# Patient Record
Sex: Female | Born: 1960
Health system: Southern US, Community
[De-identification: ages and names within clinical notes are randomized; demographics above are authoritative.]

## PROBLEM LIST (undated history)

## (undated) DIAGNOSIS — I10 Essential (primary) hypertension: Secondary | ICD-10-CM

## (undated) DIAGNOSIS — E039 Hypothyroidism, unspecified: Secondary | ICD-10-CM

## (undated) DIAGNOSIS — E669 Obesity, unspecified: Secondary | ICD-10-CM

## (undated) DIAGNOSIS — IMO0002 Reserved for concepts with insufficient information to code with codable children: Secondary | ICD-10-CM

## (undated) DIAGNOSIS — C50919 Malignant neoplasm of unspecified site of unspecified female breast: Secondary | ICD-10-CM

## (undated) DIAGNOSIS — C4491 Basal cell carcinoma of skin, unspecified: Secondary | ICD-10-CM

## (undated) DIAGNOSIS — R112 Nausea with vomiting, unspecified: Secondary | ICD-10-CM

## (undated) DIAGNOSIS — N809 Endometriosis, unspecified: Secondary | ICD-10-CM

## (undated) DIAGNOSIS — E1165 Type 2 diabetes mellitus with hyperglycemia: Secondary | ICD-10-CM

## (undated) DIAGNOSIS — T4145XA Adverse effect of unspecified anesthetic, initial encounter: Secondary | ICD-10-CM

## (undated) DIAGNOSIS — T8859XA Other complications of anesthesia, initial encounter: Secondary | ICD-10-CM

## (undated) DIAGNOSIS — I219 Acute myocardial infarction, unspecified: Secondary | ICD-10-CM

## (undated) DIAGNOSIS — E785 Hyperlipidemia, unspecified: Secondary | ICD-10-CM

## (undated) DIAGNOSIS — Z9889 Other specified postprocedural states: Secondary | ICD-10-CM

## (undated) DIAGNOSIS — E118 Type 2 diabetes mellitus with unspecified complications: Secondary | ICD-10-CM

## (undated) DIAGNOSIS — I2510 Atherosclerotic heart disease of native coronary artery without angina pectoris: Secondary | ICD-10-CM

## (undated) HISTORY — PX: CARDIAC CATHETERIZATION: SHX172

## (undated) HISTORY — PX: BREAST SURGERY: SHX581

## (undated) HISTORY — PX: ABDOMINAL HYSTERECTOMY: SHX81

## (undated) HISTORY — PX: EYE SURGERY: SHX253

## (undated) HISTORY — PX: MASTECTOMY: SHX3

---

## 1998-02-11 ENCOUNTER — Other Ambulatory Visit: Admission: RE | Admit: 1998-02-11 | Discharge: 1998-02-11 | Payer: Self-pay | Admitting: Obstetrics & Gynecology

## 1999-03-23 ENCOUNTER — Other Ambulatory Visit: Admission: RE | Admit: 1999-03-23 | Discharge: 1999-03-23 | Payer: Self-pay | Admitting: Obstetrics & Gynecology

## 2000-04-03 ENCOUNTER — Other Ambulatory Visit: Admission: RE | Admit: 2000-04-03 | Discharge: 2000-04-03 | Payer: Self-pay | Admitting: Obstetrics & Gynecology

## 2001-01-22 ENCOUNTER — Ambulatory Visit (HOSPITAL_COMMUNITY): Admission: RE | Admit: 2001-01-22 | Discharge: 2001-01-22 | Payer: Self-pay | Admitting: Oncology

## 2001-01-22 ENCOUNTER — Encounter (HOSPITAL_COMMUNITY): Payer: Self-pay | Admitting: Oncology

## 2001-05-16 HISTORY — PX: TOTAL ABDOMINAL HYSTERECTOMY W/ BILATERAL SALPINGOOPHORECTOMY: SHX83

## 2002-01-21 ENCOUNTER — Encounter (HOSPITAL_COMMUNITY): Payer: Self-pay | Admitting: Oncology

## 2002-01-21 ENCOUNTER — Ambulatory Visit (HOSPITAL_COMMUNITY): Admission: RE | Admit: 2002-01-21 | Discharge: 2002-01-21 | Payer: Self-pay | Admitting: Oncology

## 2002-08-12 ENCOUNTER — Other Ambulatory Visit: Admission: RE | Admit: 2002-08-12 | Discharge: 2002-08-12 | Payer: Self-pay | Admitting: Obstetrics & Gynecology

## 2002-09-17 ENCOUNTER — Ambulatory Visit: Admission: RE | Admit: 2002-09-17 | Discharge: 2002-09-17 | Payer: Self-pay | Admitting: Gynecologic Oncology

## 2002-10-03 ENCOUNTER — Encounter: Payer: Self-pay | Admitting: Gynecology

## 2002-10-08 ENCOUNTER — Inpatient Hospital Stay (HOSPITAL_COMMUNITY): Admission: RE | Admit: 2002-10-08 | Discharge: 2002-10-11 | Payer: Self-pay | Admitting: Obstetrics & Gynecology

## 2002-10-08 ENCOUNTER — Encounter (INDEPENDENT_AMBULATORY_CARE_PROVIDER_SITE_OTHER): Payer: Self-pay

## 2004-04-13 ENCOUNTER — Ambulatory Visit: Payer: Self-pay | Admitting: Oncology

## 2004-04-19 ENCOUNTER — Ambulatory Visit (HOSPITAL_COMMUNITY): Admission: RE | Admit: 2004-04-19 | Discharge: 2004-04-19 | Payer: Self-pay | Admitting: Oncology

## 2005-04-11 ENCOUNTER — Ambulatory Visit: Payer: Self-pay | Admitting: Oncology

## 2009-04-02 ENCOUNTER — Ambulatory Visit (HOSPITAL_COMMUNITY): Admission: RE | Admit: 2009-04-02 | Discharge: 2009-04-02 | Payer: Self-pay | Admitting: Ophthalmology

## 2010-08-18 LAB — BASIC METABOLIC PANEL
BUN: 11 mg/dL (ref 6–23)
CO2: 29 mEq/L (ref 19–32)
Calcium: 9.6 mg/dL (ref 8.4–10.5)
Chloride: 105 mEq/L (ref 96–112)
Creatinine, Ser: 0.6 mg/dL (ref 0.4–1.2)
GFR calc Af Amer: >60 mL/min (ref >60)
GFR calc non Af Amer: >60 mL/min (ref >60)
Glucose, Bld: 139 mg/dL — ABNORMAL HIGH (ref 70–99)
Potassium: 4.7 mEq/L (ref 3.5–5.1)
Sodium: 141 mEq/L (ref 135–145)

## 2010-08-18 LAB — HEMOGLOBIN AND HEMATOCRIT, BLOOD
HCT: 37.5 % (ref 36.0–46.0)
Hemoglobin: 12.8 g/dL (ref 12.0–15.0)

## 2010-08-18 LAB — GLUCOSE, CAPILLARY: Glucose-Capillary: 169 mg/dL — ABNORMAL HIGH (ref 70–99)

## 2010-10-01 NOTE — Discharge Summary (Signed)
[name], [name]                         ACCOUNT NO.:  [id number]   MEDICAL RECORD NO.:  [id number]                   PATIENT TYPE:  INP   LOCATION:  0463                                 FACILITY:  [facility]   PHYSICIAN:  Ilda Mori, M.D.                DATE OF BIRTH:  11-27-1960   DATE OF ADMISSION:  10/08/2002  DATE OF DISCHARGE:  10/11/2002                                 DISCHARGE SUMMARY   FINAL DIAGNOSES:  1. Ovarian endometriosis.  2. Uterine endometriosis.   SECONDARY DIAGNOSES:  1. History of breast malignancy.  2. Chronic hypertension.   PROCEDURES:  Total abdominal hysterectomy and bilateral salpingo-  oophorectomy.   SURGEON:  Dr. Stanford Breed.   ASSISTANT:  Dr. Ilda Mori.   COMPLICATIONS:  None.   CONDITION ON DISCHARGE:  Improved.   HISTORY OF PRESENT ILLNESS:  This is a 50 year old gravida 1, para 1 female  who presents with a persistent right adnexal mass and dysmenorrhea.  The  patient has a history of stage I breast cancer which was diagnosed in  September 1995.  The patient has been disease-free since that time.  She was  noted to have an ovarian cyst in 2001.  At that time her CA125 was 8.  She  was followed, with no significant change in her symptomatology of her  adnexal mass.  A follow-up ultrasound was done in April 2004, which revealed  that the mass had grown slightly, and her CA125 had risen slightly from 8 in  2002 to 28 in April 2004.  Because of the slight increased size of the mass,  the decision was made to proceed with laparotomy.  In addition, the patient  claimed that since her menses have returned following her chemotherapy her  periods have been painful and, for this reason, it was felt that regardless  of whether or not the mass was malignant a hysterectomy would be performed.   HOSPITAL COURSE:  The patient was taken to the operating room on the day of  surgery, where a total abdominal hysterectomy and bilateral  salpingo-  oophorectomy were performed.  Frozen section on the right ovarian mass  revealed endometriosis.  The patient's postoperative course was benign,  without significant fever or anemia.  On the third postoperative day she was  felt to be ready for discharge.  She was discharged on a regular diet, told  to limit her activity.  She was given Tylox 1-2 tablets to take every four  hours for pain, and to return to the office in two weeks for follow-up  evaluation.   LABORATORY DATA:  Her pathology report confirmed endometriosis in the right  ovary.  The uterus contained serosal endometriosis.  The left ovary, as  well, contained endometriosis.  There were no atypical or cancer cells in  the specimen.  Her admission hemoglobin was 12.3 with a white count of 8100.  Postoperatively her  hemoglobin  was 11.1 with a white count of 1400.  Liver function tests were performed  and were within normal limits.  Her creatinine, electrolytes, and glucose  were normal preoperatively.  While she was on IVs her glucose rose to 163.  Her blood type is A positive.                                               Ilda Mori, M.D.    RK/MEDQ  D:  10/30/2002  T:  10/30/2002  Job:  [id number]   cc:   De Blanch, M.D.

## 2010-10-01 NOTE — Consult Note (Signed)
Hansen, [name]                         ACCOUNT NO.:  [id number]   MEDICAL RECORD NO.:  [id number]                   PATIENT TYPE:  OUT   LOCATION:  GYN                                  FACILITY:  [facility]   PHYSICIAN:  [name], M.D.                 DATE OF BIRTH:  May 28, 1960   DATE OF CONSULTATION:  09/17/2002  DATE OF DISCHARGE:                                   CONSULTATION   CHIEF COMPLAINT:  This 50 year old woman is seen at the request of Dr.  Ilda Hansen because of a persistent/enlarging adnexal mass with a prior  history of early-onset breast cancer.   HISTORY OF PRESENT ILLNESS:  The patient was treated for left breast cancer  in 09/95 with, eventually, a left modified radical mastectomy and lymph node  dissection.  She had stage I disease and positive receptors.  She was  treated with six months of adjutant chemotherapy with CMF followed by five  years of tamoxifen through 02/1999.  She was amenorrheic immediate during  and after chemotherapy, but menses resumed, and she is currently having  menses q/ month with flow lasting for approximately seven days.  She was  suspected to have an adnexal mass on examination in 12/01, and ultrasound  revealed a 4.8-cm right ovarian cyst.  Follow-up ultrasound on 05/02  revealed a 5.6 x 4.5 x 3.9 cyst with a thick septation.  Her CA-125 was 8,  and discussion was held with Dr. Loree Hansen.  It was elected to follow  the patient; however, the patient states that, while she had annual  examinations, she had no follow-up ultrasounds until 04/04, when ultrasound  revealed a 6.0 x 5.0 x 4.6-cm complex cyst with thick septum.  There was a  questionable small amount of free fluid in the pelvis, and CA-125 value was  27.9.   The patient notes voluntary weight loss over the past six months and was  asymptomatic until a couple of months ago when she began experiencing a dull  ache in the right lower quadrant.  She denies  dysmenorrhea or dyspareunia.  She does complain of painful epigastric cramps, which arise sporadically  when she is under pressure or anxious, approximately three times per month.  These usually are self-limited and are occasionally associated with  diarrhea.   PAST MEDICAL HISTORY:  1. Psoriasis.  2. Breast cancer, as above.  3. Depression (nonsuicidal).  4. Hypertension.   PAST SURGICAL HISTORY:  1. Caesarean section.  2. Left modified radical mastectomy/nodes, as above.   MEDICATIONS:  1. Lisinopril.  2. Lexapro.   ALLERGIES:  None.   PERSONAL SOCIAL HISTORY:  The patient is married.  Denies tobacco or  ethanol.   FAMILY HISTORY:  Mother with uterine cancer but no breast, ovarian or colon  malignancies.   REVIEW OF SYSTEMS:  Otherwise negative, including no general systemic  complaints, neurologic complaints, symptoms  of asthma, pneumonia,  hemoptysis, angina, congestive heart failure or palpitations. GI:  Symptoms  as noted above with no real change in bowel function.  GU:  No change in  bladder function, hematuria, stones, recurrent UTI.  OB/GYN:  Per HPI.  Heme/Lymph:  No lymphadenopathy or bleeding/bruisability.   PHYSICAL EXAMINATION:  VITAL SIGNS:  210 pounds, blood pressure 132/100,  pulse 100, respirations 24.  GENERAL:  Patient is anxious, alert and oriented x 3, no acute distress.  ENT:  Benign with clear oropharynx.  NECK:  Supple without goiter.  LUNGS:  Fields are clear to percussion and auscultation.  HEART:  Sounds are regular, and there is no JVD.  BREASTS:  Examination is deferred.  ABDOMEN:  The abdomen was soft and benign without ascites, mass, or  organomegaly.  Transverse lower abdominal incision is well-healed.  The  abdomen is nontender, and there is no ascites.  BACK:  There is no back or CVA tenderness.  EXTREMITIES:  Have full strength without edema.  NEUROLOGIC:  Screen is normal.  PELVIC:  External genitalia, BUS are normal to  inspection/palpation.  The  bladder and urethra are normal with good support.  Cervix is mobile without  lesions or tenderness to mobility.  Bimanual and rectovaginal examinations  reveal uterus normal size.  A 5-cm nontender cystic mass in the right adnexa  and normal left ovary.  There is no cul-de-sac nodularity or tenderness on  rectovaginal examination.   ASSESSMENT:  1. Likely benign septated cyst.  2. Prior history of premenopausal breast cancer.   PLAN:  I had a long discussion with the patient regarding her disease  process.  I think that this is likely a benign mass, but she has a likely  benign process now but is at increased risk for ovarian and uterine  malignancy in the future, based on her family history, personal history of  early-onset breast cancer and tamoxifen use.  Patient is considering  definitive surgery with TAH/BSO, and I believe that this would be  reasonable.  I would choose to explore this patient through a Pfannenstiel  incision and would obtain a frozen section after removing the mass.  The  surgery will be coordinated between Dr. Arlyce Hansen and our office for some time  in the near future.  I answered multiple questions posed by the patient, and  she is in agreement with this plan.  I, likewise, contacted Dr. Arlyce Hansen by  telephone, and he is in agreement with this assessment.                                               [name], M.D.    JTS/MEDQ  D:  09/17/2002  T:  09/18/2002  Job:  [id number]

## 2010-10-01 NOTE — Op Note (Signed)
[name], [name]                         ACCOUNT NO.:  [id number]   MEDICAL RECORD NO.:  [id number]                   PATIENT TYPE:  INP   LOCATION:  0004                                 [facility]ACILITY:  [facility]   PHYSICIAN:  De Blanch, M.D.         DATE OF BIRTH:  11-20-1960   DATE OF PROCEDURE:  DATE OF DISCHARGE:                                 OPERATIVE REPORT   PREOPERATIVE DIAGNOSIS:  Complex pelvis mass with past history of breast  cancer.   POSTOPERATIVE DIAGNOSIS:  Advanced endometriosis.   OPERATION/PROCEDURE:  1. Total abdominal hysterectomy.  2. Bilateral salpingo-oophorectomy.  3. Lysis of adhesions.   SURGEON:  De Blanch, M.D.   ASSISTANT:  Ilda Mori, M.D.  Telford Nab, R.N.   ANESTHESIA:  General orotracheal tube.   ESTIMATED BLOOD LOSS:  250 mL.   SURGICAL FINDINGS:  At time of exploratory laparotomy, the patient had a 7  cm right ovarian mass which was densely adherent to the lateral pelvic  sidewall, the sigmoid mesentery, posterior cul-de-sac and the posterior  aspect of the broad ligament and uterus.  There were endometriosis implants  on the left tube and ovary and anterior cul-de-sac and the posterior cul-de-  sac was densely involved with deep endometriosis.  The uterus was slightly  enlarged.  On frozen section, a mesocyst adenoma was found within the  endometrioma on the right side.  No evidence of malignancy was found.   DESCRIPTION OF PROCEDURE:  The patient was brought to the operating room and  after satisfactory attainment of general anesthesia, was placed in the  modified lithotomy position in Jackpot stirrups.  The anterior abdominal wall,  perineum and vagina were prepped with Betadine.  A Foley catheter was placed  and the patient was draped.  The abdomen was entered through a low midline  incision.  The upper abdomen was explored and all organs and diaphragm were  found to be normal.  Buckwalter retractor  was positioned.  The bowel was  packed out of the pelvis.  The right retroperitoneum was incised.  The round  ligament was divided and the proximal portion of the round ligament grasped  with retraction.  The retroperitoneal space was opened, identified the  vessels and ureter.  The ovarian vessels were skeletonized, clamped, cut,  free tied and suture ligated.  The bladder flap was advanced.  Using sharp  and blunt dissection, the ureter was identified and followed.  As we  mobilized the cystic mass that was adherent to the sigmoid colon, it  ruptured and old blood extruded from the cyst consistent with a chocolate  cyst of the endometriosis.  With care taken to avoid injury to the ureter,  the peritoneum of the broad ligament and lateral pelvic sidewall was  incised, thus mobilizing the adherent ovary and fallopian tube until they  were fully mobilized.  These were then transected from the ovarian ligament  and fallopian tube and  submitted to frozen section with the above-noted  findings.  The left pelvic sidewall was opened, identify the left ureter.  The ovarian vessels were skeletonized, clamped, cut, free tied and suture  ligated.  The peritoneum was likewise incised just above the ureter with the  ureter under direct visualization.  The bladder flap was advanced.  The  uterine vessels were skeletonized, clamped, cut and suture ligated.  The  rectovaginal septum was then developed with sharp dissection.  Hemostasis  was then achieved with electrocautery.  Once the rectum was mobilized from  its dense adherence to the posterior aspect of the uterus, we proceeded with  clamping the cardinal ligaments, uterosacral ligaments and vaginal angles.  The vagina was transected at its juncture with the cervix.  The uterus,  cervix, left tube and ovary were handed off the operative field.  Vaginal  angles were transfixed and the central portion of the vagina closed with  interrupted  figure-of-eight sutures of 0 Vicryl.  There was oozing in the  posterior cul-de-sac where the endometriosis was quite extensive and where  we did extensive dissection.  This was controlled with a series of Halban-  type sutures using 0 Vicryl.  Initial hemostasis was achieved with  electrocautery.  The pelvis was irrigated.  Hemostasis was noted.  The packs  and retractors were removed.  The anterior abdominal wall was then closed in  layers, first in a running mass closure using #1 PDS.  It was noted the  patient had a small umbilical hernia.  The fascial defect was closed with an  interrupted figure-of-eight suture of 0 Prolene.  The subcutaneous tissue  was irrigated.  Hemostasis achieved with cautery and reapproximated with  interrupted 3-0 Vicryl sutures.  Skin was closed with skin staples.  Dressing was applied.  The patient was awakened from anesthesia and taken to  the recovery room in satisfactory condition.  Sponge, needle and instrument  counts were correct x2.                                               De Blanch, M.D.    DC/MEDQ  D:  10/08/2002  T:  10/08/2002  Job:  [id number]   cc:   Ilda Mori, M.D.  [address]  [place], [place] [address]  Fax: [phone]   Telford Nab, R.N.

## 2011-05-17 DIAGNOSIS — C4491 Basal cell carcinoma of skin, unspecified: Secondary | ICD-10-CM

## 2011-05-17 HISTORY — DX: Basal cell carcinoma of skin, unspecified: C44.91

## 2011-05-17 HISTORY — PX: SKIN CANCER EXCISION: SHX779

## 2012-10-03 ENCOUNTER — Encounter (HOSPITAL_COMMUNITY): Payer: Self-pay

## 2012-10-03 ENCOUNTER — Inpatient Hospital Stay (HOSPITAL_COMMUNITY)
Admission: EM | Admit: 2012-10-03 | Discharge: 2012-10-06 | DRG: 247 | Disposition: A | Discharge status: Home / Self Care | Payer: 59 | Attending: Cardiology | Admitting: Cardiology

## 2012-10-03 ENCOUNTER — Emergency Department (HOSPITAL_COMMUNITY): Payer: 59

## 2012-10-03 DIAGNOSIS — K838 Other specified diseases of biliary tract: Secondary | ICD-10-CM | POA: Diagnosis present

## 2012-10-03 DIAGNOSIS — R079 Chest pain, unspecified: Secondary | ICD-10-CM

## 2012-10-03 DIAGNOSIS — E1165 Type 2 diabetes mellitus with hyperglycemia: Secondary | ICD-10-CM

## 2012-10-03 DIAGNOSIS — Z853 Personal history of malignant neoplasm of breast: Secondary | ICD-10-CM

## 2012-10-03 DIAGNOSIS — I214 Non-ST elevation (NSTEMI) myocardial infarction: Secondary | ICD-10-CM | POA: Diagnosis present

## 2012-10-03 DIAGNOSIS — E119 Type 2 diabetes mellitus without complications: Secondary | ICD-10-CM

## 2012-10-03 DIAGNOSIS — E039 Hypothyroidism, unspecified: Secondary | ICD-10-CM

## 2012-10-03 DIAGNOSIS — E118 Type 2 diabetes mellitus with unspecified complications: Secondary | ICD-10-CM | POA: Diagnosis present

## 2012-10-03 DIAGNOSIS — Z780 Asymptomatic menopausal state: Secondary | ICD-10-CM

## 2012-10-03 DIAGNOSIS — C50919 Malignant neoplasm of unspecified site of unspecified female breast: Secondary | ICD-10-CM

## 2012-10-03 DIAGNOSIS — Z85828 Personal history of other malignant neoplasm of skin: Secondary | ICD-10-CM

## 2012-10-03 DIAGNOSIS — R0989 Other specified symptoms and signs involving the circulatory and respiratory systems: Secondary | ICD-10-CM | POA: Diagnosis present

## 2012-10-03 DIAGNOSIS — E785 Hyperlipidemia, unspecified: Secondary | ICD-10-CM

## 2012-10-03 DIAGNOSIS — R748 Abnormal levels of other serum enzymes: Secondary | ICD-10-CM | POA: Diagnosis present

## 2012-10-03 DIAGNOSIS — Z90710 Acquired absence of both cervix and uterus: Secondary | ICD-10-CM

## 2012-10-03 DIAGNOSIS — I2510 Atherosclerotic heart disease of native coronary artery without angina pectoris: Principal | ICD-10-CM | POA: Diagnosis present

## 2012-10-03 DIAGNOSIS — I2584 Coronary atherosclerosis due to calcified coronary lesion: Secondary | ICD-10-CM | POA: Diagnosis present

## 2012-10-03 DIAGNOSIS — R161 Splenomegaly, not elsewhere classified: Secondary | ICD-10-CM | POA: Diagnosis present

## 2012-10-03 DIAGNOSIS — Z6837 Body mass index (BMI) 37.0-37.9, adult: Secondary | ICD-10-CM

## 2012-10-03 DIAGNOSIS — IMO0001 Reserved for inherently not codable concepts without codable children: Secondary | ICD-10-CM | POA: Diagnosis present

## 2012-10-03 DIAGNOSIS — E8940 Asymptomatic postprocedural ovarian failure: Secondary | ICD-10-CM

## 2012-10-03 DIAGNOSIS — IMO0002 Reserved for concepts with insufficient information to code with codable children: Secondary | ICD-10-CM

## 2012-10-03 DIAGNOSIS — Z87891 Personal history of nicotine dependence: Secondary | ICD-10-CM

## 2012-10-03 DIAGNOSIS — Z955 Presence of coronary angioplasty implant and graft: Secondary | ICD-10-CM

## 2012-10-03 DIAGNOSIS — R739 Hyperglycemia, unspecified: Secondary | ICD-10-CM

## 2012-10-03 DIAGNOSIS — I10 Essential (primary) hypertension: Secondary | ICD-10-CM

## 2012-10-03 DIAGNOSIS — R0609 Other forms of dyspnea: Secondary | ICD-10-CM | POA: Diagnosis present

## 2012-10-03 HISTORY — DX: Type 2 diabetes mellitus with hyperglycemia: E11.65

## 2012-10-03 HISTORY — DX: Essential (primary) hypertension: I10

## 2012-10-03 HISTORY — DX: Endometriosis, unspecified: N80.9

## 2012-10-03 HISTORY — DX: Reserved for concepts with insufficient information to code with codable children: IMO0002

## 2012-10-03 HISTORY — DX: Hyperlipidemia, unspecified: E78.5

## 2012-10-03 HISTORY — DX: Obesity, unspecified: E66.9

## 2012-10-03 HISTORY — DX: Malignant neoplasm of unspecified site of unspecified female breast: C50.919

## 2012-10-03 HISTORY — DX: Basal cell carcinoma of skin, unspecified: C44.91

## 2012-10-03 HISTORY — DX: Type 2 diabetes mellitus with unspecified complications: E11.8

## 2012-10-03 LAB — CBC WITH DIFFERENTIAL/PLATELET
Basophils Absolute: 0 10*3/uL (ref 0.0–0.1)
Basophils Relative: 0 % (ref 0–1)
Eosinophils Absolute: 0.2 10*3/uL (ref 0.0–0.7)
Eosinophils Relative: 2 % (ref 0–5)
Lymphs Abs: 2.4 10*3/uL (ref 0.7–4.0)
MCH: 30.1 pg (ref 26.0–34.0)
MCV: 88.8 fL (ref 78.0–100.0)
Monocytes Absolute: 0.4 10*3/uL (ref 0.1–1.0)
Platelets: 217 10*3/uL (ref 150–400)
RDW: 12.5 % (ref 11.5–15.5)

## 2012-10-03 LAB — TROPONIN I
Troponin I: 0.4 ng/mL (ref <0.30)
Troponin I: <0.3 ng/mL (ref <0.30)

## 2012-10-03 LAB — URINE MICROSCOPIC-ADD ON

## 2012-10-03 LAB — URINALYSIS, ROUTINE W REFLEX MICROSCOPIC
Hgb urine dipstick: NEGATIVE
Leukocytes, UA: NEGATIVE
Nitrite: NEGATIVE
Protein, ur: NEGATIVE mg/dL
Specific Gravity, Urine: >1.03 — ABNORMAL HIGH (ref 1.005–1.030)
Urobilinogen, UA: 0.2 mg/dL (ref 0.0–1.0)

## 2012-10-03 LAB — COMPREHENSIVE METABOLIC PANEL
ALT: 31 U/L (ref 0–35)
AST: 32 U/L (ref 0–37)
Alkaline Phosphatase: 119 U/L — ABNORMAL HIGH (ref 39–117)
Calcium: 9.3 mg/dL (ref 8.4–10.5)
Creatinine, Ser: 0.4 mg/dL — ABNORMAL LOW (ref 0.50–1.10)
GFR calc Af Amer: >90 mL/min (ref >90)
Glucose, Bld: 331 mg/dL — ABNORMAL HIGH (ref 70–99)
Potassium: 3.9 mEq/L (ref 3.5–5.1)
Sodium: 133 mEq/L — ABNORMAL LOW (ref 135–145)
Total Protein: 7.1 g/dL (ref 6.0–8.3)

## 2012-10-03 LAB — LIPID PANEL
LDL Cholesterol: 186 mg/dL — ABNORMAL HIGH (ref 0–99)
Total CHOL/HDL Ratio: 5.4 RATIO
VLDL: 36 mg/dL (ref 0–40)

## 2012-10-03 LAB — LIPASE, BLOOD: Lipase: 168 U/L — ABNORMAL HIGH (ref 11–59)

## 2012-10-03 LAB — GLUCOSE, CAPILLARY

## 2012-10-03 LAB — D-DIMER, QUANTITATIVE: D-Dimer, Quant: <0.27 ug/mL-FEU (ref 0.00–0.48)

## 2012-10-03 MED ORDER — SODIUM CHLORIDE 0.9 % IJ SOLN
3.0000 mL | Freq: Two times a day (BID) | INTRAMUSCULAR | Status: DC
  Administered 2012-10-04 (×2): 3 mL via INTRAVENOUS

## 2012-10-03 MED ORDER — CITALOPRAM HYDROBROMIDE 40 MG PO TABS
40.0000 mg | ORAL_TABLET | Freq: Every day | ORAL | Status: DC
  Administered 2012-10-03 – 2012-10-06 (×4): 40 mg via ORAL
  Filled 2012-10-03: qty 2
  Filled 2012-10-03: qty 1
  Filled 2012-10-03 (×2): qty 2

## 2012-10-03 MED ORDER — HEPARIN BOLUS VIA INFUSION
4000.0000 [IU] | Freq: Once | INTRAVENOUS | Status: AC
  Administered 2012-10-03: 4000 [IU] via INTRAVENOUS
  Filled 2012-10-03: qty 4000

## 2012-10-03 MED ORDER — ONDANSETRON HCL 4 MG PO TABS
4.0000 mg | ORAL_TABLET | Freq: Four times a day (QID) | ORAL | Status: DC | PRN
  Administered 2012-10-05: 4 mg via ORAL
  Filled 2012-10-03: qty 1

## 2012-10-03 MED ORDER — SODIUM CHLORIDE 0.9 % IV SOLN
INTRAVENOUS | Status: AC
  Administered 2012-10-03: 13:00:00 via INTRAVENOUS

## 2012-10-03 MED ORDER — ENOXAPARIN SODIUM 40 MG/0.4ML ~~LOC~~ SOLN: 40.0000 mg | SUBCUTANEOUS | Status: DC

## 2012-10-03 MED ORDER — LISINOPRIL 10 MG PO TABS
10.0000 mg | ORAL_TABLET | Freq: Every day | ORAL | Status: DC
  Administered 2012-10-03 – 2012-10-06 (×4): 10 mg via ORAL
  Filled 2012-10-03 (×4): qty 1

## 2012-10-03 MED ORDER — INSULIN ASPART 100 UNIT/ML ~~LOC~~ SOLN
0.0000 [IU] | Freq: Three times a day (TID) | SUBCUTANEOUS | Status: DC
  Administered 2012-10-04: 11 [IU] via SUBCUTANEOUS
  Administered 2012-10-04: 5 [IU] via SUBCUTANEOUS
  Administered 2012-10-04 – 2012-10-05 (×3): 3 [IU] via SUBCUTANEOUS
  Administered 2012-10-05: 8 [IU] via SUBCUTANEOUS

## 2012-10-03 MED ORDER — NITROGLYCERIN 0.4 MG SL SUBL: 0.4000 mg | SUBLINGUAL_TABLET | SUBLINGUAL | Status: DC | PRN

## 2012-10-03 MED ORDER — ASPIRIN 81 MG PO CHEW
324.0000 mg | CHEWABLE_TABLET | Freq: Once | ORAL | Status: AC
  Administered 2012-10-03: 324 mg via ORAL
  Filled 2012-10-03: qty 4

## 2012-10-03 MED ORDER — METOPROLOL TARTRATE 25 MG PO TABS
25.0000 mg | ORAL_TABLET | Freq: Two times a day (BID) | ORAL | Status: DC
  Administered 2012-10-03 – 2012-10-06 (×6): 25 mg via ORAL
  Filled 2012-10-03 (×7): qty 1

## 2012-10-03 MED ORDER — ONDANSETRON HCL 4 MG/2ML IJ SOLN
4.0000 mg | Freq: Four times a day (QID) | INTRAMUSCULAR | Status: DC | PRN
  Administered 2012-10-06: 09:00:00 4 mg via INTRAVENOUS

## 2012-10-03 MED ORDER — ONDANSETRON HCL 4 MG/2ML IJ SOLN: 4.0000 mg | Freq: Three times a day (TID) | INTRAMUSCULAR | Status: DC | PRN

## 2012-10-03 MED ORDER — INSULIN ASPART 100 UNIT/ML ~~LOC~~ SOLN
0.0000 [IU] | Freq: Every day | SUBCUTANEOUS | Status: DC
  Administered 2012-10-03: 3 [IU] via SUBCUTANEOUS

## 2012-10-03 MED ORDER — IBUPROFEN 400 MG PO TABS
400.0000 mg | ORAL_TABLET | Freq: Once | ORAL | Status: AC
  Administered 2012-10-03: 400 mg via ORAL
  Filled 2012-10-03: qty 1

## 2012-10-03 MED ORDER — ACETAMINOPHEN 650 MG RE SUPP: 650.0000 mg | Freq: Four times a day (QID) | RECTAL | Status: DC | PRN

## 2012-10-03 MED ORDER — SODIUM CHLORIDE 0.9 % IV SOLN
INTRAVENOUS | Status: DC
  Administered 2012-10-03: 1000 mL via INTRAVENOUS
  Administered 2012-10-04 (×2): via INTRAVENOUS

## 2012-10-03 MED ORDER — LEVOTHYROXINE SODIUM 125 MCG PO TABS
125.0000 ug | ORAL_TABLET | Freq: Every day | ORAL | Status: DC
  Administered 2012-10-04 – 2012-10-06 (×3): 125 ug via ORAL
  Filled 2012-10-03 (×4): qty 1

## 2012-10-03 MED ORDER — ACETAMINOPHEN 325 MG PO TABS
650.0000 mg | ORAL_TABLET | Freq: Four times a day (QID) | ORAL | Status: DC | PRN
  Administered 2012-10-04 – 2012-10-06 (×6): 650 mg via ORAL
  Filled 2012-10-03 (×3): qty 2

## 2012-10-03 MED ORDER — HEPARIN (PORCINE) IN NACL 100-0.45 UNIT/ML-% IJ SOLN
2000.0000 [IU]/h | INTRAMUSCULAR | Status: DC
  Administered 2012-10-03: 950 [IU]/h via INTRAVENOUS
  Administered 2012-10-04 (×3): 1300 [IU]/h via INTRAVENOUS
  Administered 2012-10-05: 2000 [IU]/h via INTRAVENOUS
  Filled 2012-10-03 (×5): qty 250

## 2012-10-03 MED ORDER — ASPIRIN EC 325 MG PO TBEC
325.0000 mg | DELAYED_RELEASE_TABLET | Freq: Every day | ORAL | Status: DC
Start: 2012-10-03 — End: 2012-10-05
  Administered 2012-10-04 – 2012-10-05 (×2): 325 mg via ORAL
  Filled 2012-10-03 (×2): qty 1

## 2012-10-03 NOTE — Progress Notes (Signed)
CRITICAL VALUE ALERT  Critical value received:  Troponin 0.4  Date of notification:  10/03/2012  Time of notification:  1740  Critical value read back:yes  Nurse who received alert:  Schonewitz, Candelaria Stagers  MD notified (1st page):  Memon  Time of first page:  1742  MD notified (2nd page):  Time of second page:  Responding MD:  Kerry Hough  Time MD responded:  1755

## 2012-10-03 NOTE — ED Notes (Signed)
Pt reports chest pain after eating last night.  Describes as pressure.  Says pain comes and goes and radiates around to back of neck at times.    Reports didn't feel well all day yesterday but the chest pain didn't start until last night.  Also reports nausea and SOB.  PT says feels like needs to belch.  Reports drank some coke this am but said it hurt her chest when she swallowed.  Pt alert and oriented.

## 2012-10-03 NOTE — Progress Notes (Signed)
ANTICOAGULATION CONSULT NOTE - Initial Consult  Pharmacy Consult for Heparin Indication: chest pain/ACS  Allergies  Allergen Reactions  . Codeine Nausea And Vomiting    "deathly sick"  . Demerol (Meperidine)     Pt states all pain meds make her sick on her stomach  . Dilaudid (Hydromorphone Hcl) Nausea And Vomiting  . Morphine And Related Nausea And Vomiting    Patient Measurements: Height: 5\' 4"  (162.6 cm) Weight: 220 lb (99.791 kg) IBW/kg (Calculated) : 54.7 Heparin Dosing Weight: 79 kg  Vital Signs: Temp: 97.8 F (36.6 C) (05/21 1448) Temp src: Oral (05/21 1448) BP: 136/78 mmHg (05/21 1448) Pulse Rate: 82 (05/21 1448)  Labs:  Recent Labs  10/03/12 1018 10/03/12 1712  HGB 13.2  --   HCT 38.9  --   PLT 217  --   CREATININE 0.40*  --   TROPONINI <0.30 0.40*    Estimated Creatinine Clearance: 93.3 ml/min (by C-G formula based on Cr of 0.4).   Medical History: Past Medical History  Diagnosis Date  . Hypertension   . Diabetes mellitus without complication     Medications:  Scheduled:  . sodium chloride   Intravenous STAT  . aspirin EC  325 mg Oral Daily  . citalopram  40 mg Oral Daily  . enoxaparin (LOVENOX) injection  40 mg Subcutaneous Q24H  . [START ON 10/04/2012] insulin aspart  0-15 Units Subcutaneous TID WC  . insulin aspart  0-5 Units Subcutaneous QHS  . [START ON 10/04/2012] levothyroxine  125 mcg Oral QAC breakfast  . lisinopril  10 mg Oral Daily  . metoprolol tartrate  25 mg Oral BID  . sodium chloride  3 mL Intravenous Q12H    Assessment: 52 yo F admitted with chest pain.  Initial troponin & EKG were normal, however 2nd set of cardiac enzymes are now positive.   CBC reviewed.  No bleeding noted.   Goal of Therapy:  Heparin level 0.3-0.7 units/ml Monitor platelets by anticoagulation protocol: Yes   Plan:  Give 4000 units bolus x 1 Start heparin infusion at 950 units/hr Check anti-Xa level in 6 hours and daily while on heparin Continue  to monitor H&H and platelets  Denisha Hoel, [facility] 10/03/2012,6:19 PM

## 2012-10-03 NOTE — H&P (Addendum)
[facility] History and Physical  [name] ZOX:[id number] DOB: 1961/04/24 DOA: 10/03/2012  Referring physician: Dr. Manus Gunning PCP: Estanislado Pandy, MD  Specialists:  Chief Complaint: Chest pain  HPI: [name] is a 52 y.o. female past medical history of hypertension, diabetes, obesity. Patient was in usual state of health when yesterday after having dinner, she developed onset of substernal chest pressure/pain. Patient was watching TV when her symptoms began. She reports this discomfort as starting in her chest radiating up into her neck. It was episodes were was stabbing normocephalic pressure. This was exacerbated by physical exertion and was relieved by rest. Pain has been waxing and waning but has never really resolved since its onset. She has associated shortness of breath, nausea, denies vomiting. She reports diaphoresis. She has never had any similar discomfort in the past. She has had antireflux and reports that this feels significantly different. Denies any fever or cough, no diarrhea or dysuria. She's never had any prior cardiac testing. She was evaluated in the emergency room and had a nonacute EKG and negative troponin. She was referred for observation.  Review of Systems: Pertinent positives as per history of present illness, otherwise negative  Past Medical History  Diagnosis Date  . Hypertension   . Diabetes mellitus without complication    Past Surgical History  Procedure Laterality Date  . Mastectomy     Social History:  reports that she quit smoking about 23 years ago. Her smoking use included Cigarettes. She has a 15 pack-year smoking history. She does not have any smokeless tobacco history on file. She reports that  drinks alcohol. She reports that she does not use illicit drugs.   Allergies  Allergen Reactions  . Codeine Nausea And Vomiting    "deathly sick"  . Demerol (Meperidine)     Pt states all pain meds make her sick on her stomach  . Dilaudid  (Hydromorphone Hcl) Nausea And Vomiting  . Morphine And Related Nausea And Vomiting    Family history: Mother has been diagnosed with Alzheimer's disease, father had a history of heart disease and cardiac bypass, no history of premature coronary disease in the family.  Prior to Admission medications   Medication Sig Start Date End Date Taking? Authorizing Provider  citalopram (CELEXA) 40 MG tablet Take 40 mg by mouth daily.   Yes Historical Provider, MD  levothyroxine (SYNTHROID, LEVOTHROID) 125 MCG tablet Take 125 mcg by mouth daily before breakfast.   Yes Historical Provider, MD  lisinopril (PRINIVIL,ZESTRIL) 10 MG tablet Take 10 mg by mouth daily.   Yes Historical Provider, MD  metFORMIN (GLUCOPHAGE-XR) 500 MG 24 hr tablet Take 1,500 mg by mouth daily with breakfast.   Yes Historical Provider, MD   Physical Exam: Filed Vitals:   10/03/12 1250 10/03/12 1300 10/03/12 1310 10/03/12 1448  BP:    136/78  Pulse: 78 83 79 82  Temp:    97.8 F (36.6 C)  TempSrc:    Oral  Resp: 19 22 14 16   Height:   5\' 4"  (1.626 m)   Weight:   99.791 kg (220 lb)   SpO2: 95% 92% 95% 98%     General:  No acute distress  Eyes: Pupils are equal round react to light  ENT: Mucous membranes are moist  Neck: Supple  Cardiovascular: S1, S2, regular rate and rhythm, chest pain is not reproducible on palpation  Respiratory: Clear to auscultation bilaterally  Abdomen: Soft, nontender, nondistended, bowel sounds are active  Skin: No rashes  Musculoskeletal: No pedal edema bilaterally  Psychiatric: Normal affect, cooperative with exam   Neurologic: Grossly intact, nonfocal  Labs on Admission:  Basic Metabolic Panel:  Recent Labs Lab 10/03/12 1018  NA 133*  K 3.9  CL 95*  CO2 25  GLUCOSE 331*  BUN 10  CREATININE 0.40*  CALCIUM 9.3   Liver Function Tests:  Recent Labs Lab 10/03/12 1018  AST 32  ALT 31  ALKPHOS 119*  BILITOT 0.5  PROT 7.1  ALBUMIN 3.6   No results found for this  basename: LIPASE, AMYLASE,  in the last 168 hours No results found for this basename: AMMONIA,  in the last 168 hours CBC:  Recent Labs Lab 10/03/12 1018  WBC 9.7  NEUTROABS 6.7  HGB 13.2  HCT 38.9  MCV 88.8  PLT 217   Cardiac Enzymes:  Recent Labs Lab 10/03/12 1018  TROPONINI <0.30    BNP (last 3 results) No results found for this basename: PROBNP,  in the last 8760 hours CBG: No results found for this basename: GLUCAP,  in the last 168 hours  Radiological Exams on Admission: Dg Chest 2 View  10/03/2012   *RADIOLOGY REPORT*  Clinical Data: Chest pain, shortness of breath, cough and chest congestion.  CHEST - 2 VIEW  Comparison: 04/19/2004  Findings: There is slight peribronchial thickening consistent with bronchitis.  Lungs are otherwise clear.  Heart size and vascularity are normal.  No osseous abnormality.  IMPRESSION: Slight bronchitic changes.   Original Report Authenticated By: Francene Boyers, M.D.    EKG: Independently reviewed. Sinus rhythm without any acute changes  Assessment/Plan Principal Problem:   Chest pain Active Problems:   Essential hypertension, benign   Type II or unspecified type diabetes mellitus without mention of complication, not stated as uncontrolled   Hypothyroidism   Morbid obesity   1. Chest pain. Patient has significant risk factors including morbid obesity, hypertension, diabetes. It is possible that her discomfort could be related to GERD. But due to multiple risk factors, she'll be admitted to the hospital overnight for serial cardiac enzymes. We will repeat EKG in the morning. Check 2-D echocardiogram. Check lipid panel as well as hemoglobin A1c. We will keep the patient n.p.o. after midnight. We will request a cardiology consultation to see if she needs a stress test while here in the hospital. 2. Diabetes. Hold metformin start sliding scale insulin. 3. Hypertension. Continue lisinopril. 4. Hypothyroidism. Continue  Synthroid. 5. Obesity. Counseled on the importance of diet/exercise   Code Status: Full code Family Communication: discussed with patient Disposition Plan: discharge home once improved  Time spent:  MEMON,JEHANZEB [facility] Pager [phone]  If 7PM-7AM, please contact night-coverage [email or web address] Password [email or web address] 10/03/2012, 5:04 PM   Addendum:  Was informed by staff the patient's troponin was 0.4. The patient does not have any worsening chest pain. Her EKG is nonacute. Case was discussed with Dr. Daleen Squibb with cardiology. Recommendations were to start the patient on IV heparin. Continue nitroglycerin and aspirin. She's been started on a beta blocker. If patient's chest pain worsens overnight or cardiac enzymes significantly get worse, patient may need transfer to [facility] overnight. Otherwise, plan will be for cardiology to see the patient at [facility] in the morning. Will sign out to Dr. Rito Ehrlich who will be on overnight.  MEMON,JEHANZEB

## 2012-10-03 NOTE — ED Provider Notes (Signed)
History    This chart was scribed for Glynn Octave, MD by Quintella Reichert, ED scribe.  This patient was seen in room APA09/APA09 and the patient's care was started at 10:10 AM.   CSN: [id number]  Arrival date & time 10/03/12  4098     Chief Complaint  Patient presents with  . Chest Pain     The history is provided by the patient. No language interpreter was used.   HPI Comments: [name] is a 52 y.o. female who presents to the Emergency Department complaining of waxing-and-waning, moderate CP that began at 10:30 last night, with accompanying mild nausea.  Pt describes pain as heaviness and pressure, like "I'm choking" and "like I need to burp and I can't."  She states that it spreads up into her neck and throat.  Pain does not radiate to back or arms.   She states that pain becomes more severe in episodes lasting about 5 minutes.  Pain is not relieved by anything is eacerbated by lying down on her side.  Pt denies prior h/o similar symptoms.  Pt also notes recent recurrent, intermittent headaches that build up slowly and resolve on their own.  She denies abdominal pain or diaphoresis.  Pt has h/o HTN and DM, but denies h/o any cardiac problems.  Pt has never seen a cardiologist.  She states she quit smoking 23 years ago.  Pt had breast cancer many years ago but recovered completely after a mastectomy.  Pt notes she takes one 81 mg aspirin before bed each day.    PCP is Dr. Fara Chute.   Past Medical History  Diagnosis Date  . Hypertension   . Diabetes mellitus without complication     Past Surgical History  Procedure Laterality Date  . Mastectomy      History reviewed. No pertinent family history.  History  Substance Use Topics  . Smoking status: Not on file  . Smokeless tobacco: Not on file  . Alcohol Use: Yes    OB History   Grav Para Term Preterm Abortions TAB SAB Ect Mult Living                  Review of Systems A complete 10 system review of systems  was obtained and all systems are negative except as noted in the HPI and PMH.    Allergies  Codeine; Demerol; Dilaudid; and Morphine and related  Home Medications  No current outpatient prescriptions on file.  BP 137/77  Temp(Src) 98.2 F (36.8 C)  Resp 20  Ht 5\' 4"  (1.626 m)  Wt 220 lb (99.791 kg)  BMI 37.74 kg/m2  SpO2 92%  Physical Exam  Nursing note and vitals reviewed. Constitutional: She is oriented to person, place, and time. She appears well-developed and well-nourished. No distress.  HENT:  Head: Normocephalic and atraumatic.  Eyes: Conjunctivae and EOM are normal.  Neck: Normal range of motion. Neck supple. No tracheal deviation present.  Cardiovascular: Normal rate, regular rhythm, normal heart sounds and intact distal pulses.   No murmur heard. Pulmonary/Chest: Effort normal and breath sounds normal. She has no wheezes. She has no rales.  CP not reproducible  Abdominal: Soft. There is no tenderness.  Musculoskeletal: Normal range of motion. She exhibits no tenderness.  Neurological: She is alert and oriented to person, place, and time. Coordination normal.  Skin: Skin is warm and dry. No rash noted.  Psychiatric: She has a normal mood and affect. Her behavior is normal.  ED Course  Procedures (including critical care time)  DIAGNOSTIC STUDIES: Oxygen Saturation is 92% on room air, low by my interpretation.    COORDINATION OF CARE: 10:15 AM-Discussed treatment plan which includes EKG, CXR, labs and aspirin with pt at bedside and pt agreed to plan.      Labs Reviewed  COMPREHENSIVE METABOLIC PANEL - Abnormal; Notable for the following:    Sodium 133 (*)    Chloride 95 (*)    Glucose, Bld 331 (*)    Creatinine, Ser 0.40 (*)    Alkaline Phosphatase 119 (*)    All other components within normal limits  URINALYSIS, ROUTINE W REFLEX MICROSCOPIC - Abnormal; Notable for the following:    Specific Gravity, Urine >1.030 (*)    Glucose, UA >1000 (*)    All  other components within normal limits  URINE MICROSCOPIC-ADD ON - Abnormal; Notable for the following:    Squamous Epithelial / LPF FEW (*)    Bacteria, UA FEW (*)    All other components within normal limits  CBC WITH DIFFERENTIAL  TROPONIN I  D-DIMER, QUANTITATIVE   Dg Chest 2 View  10/03/2012   *RADIOLOGY REPORT*  Clinical Data: Chest pain, shortness of breath, cough and chest congestion.  CHEST - 2 VIEW  Comparison: 04/19/2004  Findings: There is slight peribronchial thickening consistent with bronchitis.  Lungs are otherwise clear.  Heart size and vascularity are normal.  No osseous abnormality.  IMPRESSION: Slight bronchitic changes.   Original Report Authenticated By: Francene Boyers, M.D.     1. Chest pain   2. Hyperglycemia       MDM  12 hour history of central chest pressure that radiates to her neck it waxes and wanes in severity. Associated with nausea and shortness of breath. Denies any cardiac history. History of high blood pressure and diabetes with remote breast cancer.  EKG without acute ischemic changes. Troponin and D-dimer negative. Hyperglycemia without evidence of DKA. Given risk factors and description of symptoms, there is concern for cardiac chest pain and patient will be admitted for observation. HEART score 4.   Date: 10/03/2012  Rate: 90  Rhythm: normal sinus rhythm  QRS Axis: normal  Intervals: normal  ST/T Wave abnormalities: normal  Conduction Disutrbances:none  Narrative Interpretation:   Old EKG Reviewed: unchanged      I personally performed the services described in this documentation, which was scribed in my presence. The recorded information has been reviewed and is accurate.      Glynn Octave, MD 10/03/12 1432

## 2012-10-03 NOTE — ED Notes (Signed)
Complain of chest pain in center of chest that goes up to neck

## 2012-10-04 ENCOUNTER — Observation Stay (HOSPITAL_COMMUNITY): Payer: 59

## 2012-10-04 ENCOUNTER — Encounter (HOSPITAL_COMMUNITY): Payer: Self-pay | Admitting: Cardiology

## 2012-10-04 DIAGNOSIS — R7309 Other abnormal glucose: Secondary | ICD-10-CM

## 2012-10-04 DIAGNOSIS — C50919 Malignant neoplasm of unspecified site of unspecified female breast: Secondary | ICD-10-CM

## 2012-10-04 DIAGNOSIS — I517 Cardiomegaly: Secondary | ICD-10-CM

## 2012-10-04 DIAGNOSIS — R079 Chest pain, unspecified: Secondary | ICD-10-CM

## 2012-10-04 DIAGNOSIS — E785 Hyperlipidemia, unspecified: Secondary | ICD-10-CM

## 2012-10-04 DIAGNOSIS — E8940 Asymptomatic postprocedural ovarian failure: Secondary | ICD-10-CM

## 2012-10-04 DIAGNOSIS — E1165 Type 2 diabetes mellitus with hyperglycemia: Secondary | ICD-10-CM

## 2012-10-04 LAB — TROPONIN I: Troponin I: <0.3 ng/mL (ref <0.30)

## 2012-10-04 LAB — BASIC METABOLIC PANEL
Chloride: 101 mEq/L (ref 96–112)
Creatinine, Ser: 0.45 mg/dL — ABNORMAL LOW (ref 0.50–1.10)
GFR calc Af Amer: >90 mL/min (ref >90)
GFR calc non Af Amer: >90 mL/min (ref >90)
Potassium: 3.8 mEq/L (ref 3.5–5.1)

## 2012-10-04 LAB — CBC
MCH: 30.5 pg (ref 26.0–34.0)
Platelets: 214 10*3/uL (ref 150–400)
RBC: 4.1 MIL/uL (ref 3.87–5.11)
RDW: 12.7 % (ref 11.5–15.5)
WBC: 8.4 10*3/uL (ref 4.0–10.5)

## 2012-10-04 LAB — GLUCOSE, CAPILLARY

## 2012-10-04 LAB — HEPARIN LEVEL (UNFRACTIONATED)
Heparin Unfractionated: <0.1 IU/mL — ABNORMAL LOW (ref 0.30–0.70)
Heparin Unfractionated: 0.15 IU/mL — ABNORMAL LOW (ref 0.30–0.70)

## 2012-10-04 MED ORDER — ATORVASTATIN CALCIUM 20 MG PO TABS
20.0000 mg | ORAL_TABLET | Freq: Every day | ORAL | Status: DC
  Administered 2012-10-04: 20 mg via ORAL
  Filled 2012-10-04: qty 1

## 2012-10-04 MED ORDER — ATORVASTATIN CALCIUM 80 MG PO TABS
80.0000 mg | ORAL_TABLET | Freq: Every day | ORAL | Status: DC
  Administered 2012-10-05: 18:00:00 80 mg via ORAL
  Filled 2012-10-04 (×3): qty 1

## 2012-10-04 MED ORDER — ALPRAZOLAM 0.5 MG PO TABS
0.5000 mg | ORAL_TABLET | Freq: Once | ORAL | Status: AC
  Administered 2012-10-04: 0.5 mg via ORAL
  Filled 2012-10-04: qty 1

## 2012-10-04 MED ORDER — HEPARIN BOLUS VIA INFUSION
2000.0000 [IU] | Freq: Once | INTRAVENOUS | Status: AC
  Administered 2012-10-04: 2000 [IU] via INTRAVENOUS
  Filled 2012-10-04: qty 2000

## 2012-10-04 MED ORDER — INSULIN DETEMIR 100 UNIT/ML ~~LOC~~ SOLN
20.0000 [IU] | Freq: Every day | SUBCUTANEOUS | Status: DC
  Administered 2012-10-04 – 2012-10-05 (×2): 20 [IU] via SUBCUTANEOUS
  Filled 2012-10-04 (×5): qty 0.2

## 2012-10-04 NOTE — Progress Notes (Signed)
Inpatient Diabetes Program Recommendations  AACE/ADA: New Consensus Statement on Inpatient Glycemic Control (2013)  Target Ranges:  Prepandial:   less than 140 mg/dL      Peak postprandial:   less than 180 mg/dL (1-2 hours)      Critically ill patients:  140 - 180 mg/dL   Results for [name], [name] (MRN [id number]) as of 10/04/2012 07:46  Ref. Range 10/03/2012 17:12  Hemoglobin A1C Latest Range: <5.7 % 11.0 (H)   Results for EZME, DUCH (MRN [id number]) as of 10/04/2012 07:46  Ref. Range 10/03/2012 22:12 10/04/2012 07:32  Glucose-Capillary Latest Range: 70-99 mg/dL 782 (H) 956 (H)   Inpatient Diabetes Program Recommendations Insulin - Basal: Please consider ordering low dose basal insulin; recommend Levemir 10 units daily. Correction (SSI): Please consider increasing Novolog correction to resistant scale.  Note: Patient has a history of diabetes and takes Metformin 1500 mg QAM at home for diabetes management.  Currently, patient is ordered to receive Novolog 0-15 units AC and Novolog 0-5 units HS for inpatient glycemic control.  A1C noted to be 11.0% and fasting blood glucose this morning was 302 mg/dl.  Please consider ordering low dose basal insulin (recommend starting with Levemir 10 units daily) and increasing Novolog correction to resistant scale to improve glycemic control.  If MD should decide to send patient home on insulin, please advise nursing staff so that patient can be properly educated prior to discharge.    Thanks, Orlando Penner, RN, MSN, CCRN Diabetes Coordinator Inpatient Diabetes Program [phone]

## 2012-10-04 NOTE — Progress Notes (Signed)
UR chart review completed.  

## 2012-10-04 NOTE — Progress Notes (Addendum)
ANTICOAGULATION CONSULT NOTE  Pharmacy Consult for Heparin Indication: chest pain/ACS  Allergies  Allergen Reactions  . Codeine Nausea And Vomiting    "deathly sick"  . Demerol (Meperidine)     Pt states all pain meds make her sick on her stomach  . Dilaudid (Hydromorphone Hcl) Nausea And Vomiting  . Morphine And Related Nausea And Vomiting    Patient Measurements: Height: 5\' 4"  (162.6 cm) Weight: 220 lb (99.791 kg) IBW/kg (Calculated) : 54.7 Heparin Dosing Weight: 79 kg  Vital Signs: Temp: 97.8 F (36.6 C) (05/22 0548) Temp src: Oral (05/22 0548) BP: 111/65 mmHg (05/22 0548) Pulse Rate: 75 (05/22 0548)  Labs:  Recent Labs  10/03/12 1018 10/03/12 1712 10/03/12 2324 10/04/12 0450 10/04/12 0836  HGB 13.2  --   --  12.5  --   HCT 38.9  --   --  37.2  --   PLT 217  --   --  214  --   HEPARINUNFRC  --   --  0.69  --  <0.10*  CREATININE 0.40*  --   --  0.45*  --   TROPONINI <0.30 0.40* <0.30 <0.30  --     Estimated Creatinine Clearance: 93.3 ml/min (by C-G formula based on Cr of 0.45).   Medical History: Past Medical History  Diagnosis Date  . Hypertension   . Diabetes mellitus without complication     Medications:  Scheduled:  . aspirin EC  325 mg Oral Daily  . atorvastatin  20 mg Oral q1800  . citalopram  40 mg Oral Daily  . [COMPLETED] heparin  2,000 Units Intravenous Once  . insulin aspart  0-15 Units Subcutaneous TID WC  . insulin aspart  0-5 Units Subcutaneous QHS  . insulin detemir  20 Units Subcutaneous Daily  . levothyroxine  125 mcg Oral QAC breakfast  . lisinopril  10 mg Oral Daily  . metoprolol tartrate  25 mg Oral BID  . sodium chloride  3 mL Intravenous Q12H    Assessment: 52 yo F admitted with chest pain.  Initial troponin & EKG were normal, however 2nd set of cardiac enzymes were positive & patient was started on heparin.  She is awaiting stress test.   Initial heparin level was therapeutic, however heparin level is now <0.1.  Heparin  appears to be infusing correctly and after discussing lab draws with patient will assume 2nd level is most accurate.   CBC reviewed.  No bleeding noted.   Goal of Therapy:  Heparin level 0.3-0.7 units/ml Monitor platelets by anticoagulation protocol: Yes   Plan:  Repeat heparin bolus 2000 units IV x1 then increase heparin infusion to 1300 units/hr Repeat 6hr heparin level Daily heparin level & CBC while on heparin F/U treatment plan  Elson Clan 10/04/2012,10:11 AM  Repeat heparin level still sub-therapeutic.  Will re-bolus heparin & increase rate. Recheck heparin level.   Junita Push, PharmD, BCPS

## 2012-10-04 NOTE — Progress Notes (Signed)
[facility] PROGRESS NOTE  NATSHA GUIDRY ZOX:[id number] DOB: [birth year] DOA: 10/03/2012 PCP: Estanislado Pandy, MD  Assessment/Plan: 1. Chest pain.  Patient had mild elevation in troponin to 0.4, but subsequent levels were normal. EKG from this morning does not show any acute changes.  Due to elevated in troponin, she was started on IV heparin for suspected NSTEMI. Echo is pending.  Cardiology has been consulted. 2. Pancreatitis, mild.  Lipase on admission was found to be mildly elevated at 168 on admission, but has since normalized to 32. This could have been contributing to #1. She denies any history of alcohol or gallstones.  Remainder of liver function tests were normal. Will obtain RUQ ultrasound to evaluate for gallstones. 3. Hyperlipidemia.  LDL is elevated at 186. Will start the patient on lipitor 20mg  daily 4. Diabetes mellitus, uncontrolled.  Hgb A1C is 11.0. She will need to be discharged home on insulin.  Based on her weight, we will start levemir at 20 units daily.  Continue sliding scale insulin. 5. HTN.  Stable 6. Morbid Obesity.  Counseled on importance of weight loss.  Code Status: full code Family Communication: discussed with patient and husband at the bedside Disposition Plan: discharge home once improved   Consultants:  Cardiology  Procedures:  none  Antibiotics:  none  HPI/Subjective: Patient feels better today, still has some chest discomfort, no shortness of breath, has a headache.  Objective: Filed Vitals:   10/03/12 1310 10/03/12 1448 10/03/12 2133 10/04/12 0548  BP:  136/78 125/72 111/65  Pulse: 79 82 82 75  Temp:  97.8 F (36.6 C) 97.9 F (36.6 C) 97.8 F (36.6 C)  TempSrc:  Oral Oral Oral  Resp: 14 16 20 16   Height: 5\' 4"  (1.626 m)     Weight: 99.791 kg (220 lb)     SpO2: 95% 98% 95% 95%    Intake/Output Summary (Last 24 hours) at 10/04/12 0902 Last data filed at 10/04/12 0500  Gross per 24 hour  Intake   1185 ml  Output      1 ml   Net   1184 ml   Filed Weights   10/03/12 1002 10/03/12 1310  Weight: 99.791 kg (220 lb) 99.791 kg (220 lb)    Exam:   General:  NAD  Cardiovascular: S1, s2 RRR  Respiratory: CTA B  Abdomen: soft, nt, nd, bs+  Musculoskeletal: no pedal edema b/l   Data Reviewed: Basic Metabolic Panel:  Recent Labs Lab 10/03/12 1018 10/04/12 0450  NA 133* 138  K 3.9 3.8  CL 95* 101  CO2 25 27  GLUCOSE 331* 306*  BUN 10 9  CREATININE 0.40* 0.45*  CALCIUM 9.3 8.8   Liver Function Tests:  Recent Labs Lab 10/03/12 1018  AST 32  ALT 31  ALKPHOS 119*  BILITOT 0.5  PROT 7.1  ALBUMIN 3.6    Recent Labs Lab 10/03/12 1712 10/04/12 0836  LIPASE 168* 32   No results found for this basename: AMMONIA,  in the last 168 hours CBC:  Recent Labs Lab 10/03/12 1018 10/04/12 0450  WBC 9.7 8.4  NEUTROABS 6.7  --   HGB 13.2 12.5  HCT 38.9 37.2  MCV 88.8 90.7  PLT 217 214   Cardiac Enzymes:  Recent Labs Lab 10/03/12 1018 10/03/12 1712 10/03/12 2324 10/04/12 0450  TROPONINI <0.30 0.40* <0.30 <0.30   BNP (last 3 results) No results found for this basename: PROBNP,  in the last 8760 hours CBG:  Recent Labs Lab 10/03/12 2212  10/04/12 0732  GLUCAP 297* 302*    No results found for this or any previous visit (from the past 240 hour(s)).   Studies: Dg Chest 2 View  10/03/2012   *RADIOLOGY REPORT*  Clinical Data: Chest pain, shortness of breath, cough and chest congestion.  CHEST - 2 VIEW  Comparison: 04/19/2004  Findings: There is slight peribronchial thickening consistent with bronchitis.  Lungs are otherwise clear.  Heart size and vascularity are normal.  No osseous abnormality.  IMPRESSION: Slight bronchitic changes.   Original Report Authenticated By: Francene Boyers, M.D.    Scheduled Meds: . aspirin EC  325 mg Oral Daily  . citalopram  40 mg Oral Daily  . insulin aspart  0-15 Units Subcutaneous TID WC  . insulin aspart  0-5 Units Subcutaneous QHS  .  levothyroxine  125 mcg Oral QAC breakfast  . lisinopril  10 mg Oral Daily  . metoprolol tartrate  25 mg Oral BID  . sodium chloride  3 mL Intravenous Q12H   Continuous Infusions: . sodium chloride 75 mL/hr at 10/04/12 0735  . heparin 950 Units/hr (10/03/12 1900)    Principal Problem:   Chest pain Active Problems:   Essential hypertension, benign   Type II or unspecified type diabetes mellitus without mention of complication, not stated as uncontrolled   Hypothyroidism   Morbid obesity    Time spent:    [facility]  [facility] Pager [phone]. If 7PM-7AM, please contact night-coverage at [email or web address], password [facility] 10/04/2012, 9:02 AM  LOS: 1 day

## 2012-10-04 NOTE — Care Management Note (Addendum)
    Page 1 of 1   10/05/2012     8:15:17 AM   CARE MANAGEMENT NOTE 10/05/2012  Patient:  [name], [name]   Account Number:  [id number]  Date Initiated:  10/04/2012  Documentation initiated by:  Sharrie Rothman  Subjective/Objective Assessment:   Pt admitted from home with CP. Pt lives with her husband and will return home at discharge. Pt is independent with ADL's.     Action/Plan:   No CM needs noted.   Anticipated DC Date:  10/06/2012   Anticipated DC Plan:  HOME/SELF CARE      DC Planning Services  CM consult      Choice offered to / List presented to:             Status of service:  Completed, signed off Medicare Important Message given?   (If response is "NO", the following Medicare IM given date fields will be blank) Date Medicare IM given:   Date Additional Medicare IM given:    Discharge Disposition:    Per UR Regulation:    If discussed at Long Length of Stay Meetings, dates discussed:    Comments:  10/05/12 0815 Arlyss Queen, RN BSN CM Pt to transfer to [facility] today for cardiac cath.  10/04/12 1330 Arlyss Queen, RN BSN CM

## 2012-10-04 NOTE — Consult Note (Signed)
Patient Name: [name]  MRN: [id number]  HPI: [name] is an 51 y.o. female referred for consultation by Dr.Jehanzeb Memon, MD for chest pain with slightly elevated troponin.  This nice woman has no history of cardiac disease, but does have significant risk factors, most notably postmenopausal status since TAH/BSO approximately 10 years ago, diabetes and hyperlipidemia. Nonetheless, she has never previously been evaluated by a cardiologist, has not testing and has no known vascular or cardiovascular disease. She presented after a number of hours of intermittent chest pressure with mild associated nausea and radiation to the neck. There was some dyspnea, but no diaphoresis nor emesis. She continued to have symptoms in the emergency department, but initial cardiac markers and EKGs were negative. Since starting heparin, symptoms have been relieved. She did not receive nitroglycerin, but was treated with aspirin.  Initial troponin was normal, a second test approximately 24 hours after the onset of symptoms was slightly elevated at 0.4. 2 subsequent tests were again normal. She is found to have gallbladder sludge without evidence of acute cholecystitis on abdominal ultrasound, splenomegaly and mildly elevated lipase on presentation, which normalized within 12 hours.  Past Medical History  Diagnosis Date  . Hypertension   . Diabetes mellitus type 2 with complications, uncontrolled   . Hyperlipidemia   . Endometriosis     TAH/BSO in 2003  . Breast carcinoma   . Basal cell carcinoma 2013    Resected from left neck  . Obesity    Past Surgical History  Procedure Laterality Date  . Mastectomy    . Total abdominal hysterectomy w/ bilateral salpingoophorectomy  2003  . Skin cancer excision  2013    Basal cell-left neck   History reviewed. No pertinent family history.  Social History:  reports that she quit smoking about 23 years ago. Her smoking use included Cigarettes. She has a 15  pack-year smoking history. She does not have any smokeless tobacco history on file. She reports that  drinks alcohol. She reports that she does not use illicit drugs.  Allergies:  Allergies  Allergen Reactions  . Codeine Nausea And Vomiting    "deathly sick"  . Demerol (Meperidine)     Pt states all pain meds make her sick on her stomach  . Dilaudid (Hydromorphone Hcl) Nausea And Vomiting  . Morphine And Related Nausea And Vomiting   Medications:  I have reviewed the patient's current medications. Scheduled: . aspirin EC  325 mg Oral Daily  . atorvastatin  20 mg Oral q1800  . citalopram  40 mg Oral Daily  . insulin detemir  20 Units Subcutaneous Daily  . levothyroxine  125 mcg Oral QAC breakfast  . lisinopril  10 mg Oral Daily  . metoprolol tartrate  25 mg Oral BID     10/03/12 10:18 AM      Result Value Range   WBC 9.7  4.0 - 10.5 K/uL   RBC 4.38  3.87 - 5.11 MIL/uL   Hemoglobin 13.2  12.0 - 15.0 g/dL   HCT 38.9  36.0 - 46.0 %   Platelets 217  150 - 400 K/uL  COMPREHENSIVE METABOLIC PANEL     Status: Abnormal      Result Value Range   Sodium 133 (*) 135 - 145 mEq/L   Potassium 3.9  3.5 - 5.1 mEq/L   Chloride 95 (*) 96 - 112 mEq/L   CO2 25  19 - 32 mEq/L   Glucose, Bld 331 (*) 70 - 99 mg/dL     BUN 10  6 - 23 mg/dL   Creatinine, Ser 0.40 (*) 0.50 - 1.10 mg/dL   Calcium 9.3  8.4 - 10.5 mg/dL   Total Protein 7.1  6.0 - 8.3 g/dL   Albumin 3.6  3.5 - 5.2 g/dL   AST 32  0 - 37 U/L   ALT 31  0 - 35 U/L   Alkaline Phosphatase 119 (*) 39 - 117 U/L   Total Bilirubin 0.5  0.3 - 1.2 mg/dL   GFR calc non Af Amer >90  >90 mL/min   GFR calc Af Amer >90  >90 mL/min   Troponin I <0.30  <0.30 ng/mL   D-Dimer, Quant <0.27  0.00 - 0.48 ug/mL-FEU  LIPID PANEL     Status: Abnormal      Result Value Range   Cholesterol 272 (*) 0 - 200 mg/dL   Triglycerides 179 (*) <150 mg/dL   HDL 50  >39 mg/dL   Total CHOL/HDL Ratio 5.4     VLDL 36  0 - 40 mg/dL   LDL Cholesterol 186 (*) 0 - 99  mg/dL      Result Value Range   Troponin I 0.40 (*) <0.30 ng/mL   Hemoglobin A1C 11.0 (*) <5.7 %   Lipase 168 (*) 11 - 59 U/L   Troponin I <0.30  <0.30 ng/mL   Troponin I <0.30  <0.30 ng/mL   Lipase 32  11 - 59 U/L   CXR  10/03/12  Slight peribronchial thickening consistent with bronchitis.  Lungs are otherwise clear.  Heart size and vascularity are normal.    Us Abdomen Complete  10/04/2012   Possible gallbladder sludge, equivocal. Hepatic steatosis.  Splenomegaly.     Echocardiogram 10/04/12: Mild LVH; nl EF, no significant valvular abnormalities.  Review of Systems: General: no anorexia, weight gain or weight loss Cardiac: no chest pain, dyspnea, orthopnea, PND,  or syncope Respiratory: no cough, sputum production or hemoptysis GI: no nausea, abdominal pain, emesis, diarrhea or constipation Integument: no significant lesions Neurologic: No muscle weakness or paralysis; no speech disturbance; no headache All other systems reviewed and are negative.  Physical Exam: Blood pressure 119/69, pulse 80, temperature 97.8 F (36.6 C), temperature source Oral, resp. rate 18, height 5' 4" (1.626 m), weight 99.791 kg (220 lb), SpO2 95.00%.;  Body mass index is 37.74 kg/(m^2). General-Well-developed; no acute distress HEENT-[place]/AT; PERRL; EOM intact; conjunctiva and lids nl Neck-No JVD; no carotid bruits Endocrine-No thyromegaly Lungs-Clear lung fields; resonant percussion; normal I-to-E ratio Cardiovascular- normal PMI; normal S1 and S2 Abdomen-BS normal; soft and non-tender without masses or organomegaly Musculoskeletal-No deformities, cyanosis or clubbing Neurologic-Nl cranial nerves; symmetric strength and tone Skin- Warm, no significant lesions Extremities-Nl distal pulses; no edema  Assessment/Plan:  Patient Active Problem List   Diagnosis Date Noted  . Chest pain 10/03/2012  . Essential hypertension, benign 10/03/2012  . Type II or unspecified type diabetes mellitus without  mention of complication, not stated as uncontrolled 10/03/2012  . Hypothyroidism 10/03/2012  . Morbid obesity 10/03/2012   Chest Pain: Description of symptoms is certainly consistent with an ischemic etiology. Although there is no evidence on physical examination for vascular disease, she does have very significant risk factors. Absence of EKG abnormalities during discomfort is reassuring, but mild elevation in troponin is not. A stress test would not provide adequate reassurance of the absence of coronary disease in the setting, and I've recommended coronary angiography. Risks and benefits described patient and her husband. They agree to proceed. She   will be transported to [facility] in the morning. Aspirin and heparin will be continued overnight. Should she report recurrent symptoms, intravenous nitroglycerin should be started and arrangements made for more urgent transfer.  Diabetes mellitus: A1c level of 11 suggests suboptimal control.  Tobacco abuse in remission: Remote history of tobacco use is probably not sure main to current problems.  Hyperlipidemia: If she does prove to have coronary disease, pharmacologic treatment will certainly be warranted. In the setting of diabetes and multiple risk factors, therapy will be initiated.  Hypertension: Presence of LVH suggest significant blood pressure elevation in the past.  Control has been good since hospital admission.  Proctor Carriker, MD 10/04/2012, 4:52 PM        

## 2012-10-04 NOTE — Progress Notes (Signed)
*  PRELIMINARY RESULTS* Echocardiogram 2D Echocardiogram has been performed.  [name] 10/04/2012, 10:01 AM

## 2012-10-05 ENCOUNTER — Encounter (HOSPITAL_COMMUNITY)
Admission: EM | Disposition: A | Discharge status: Home / Self Care | Payer: Self-pay | Source: Home / Self Care | Attending: Internal Medicine

## 2012-10-05 DIAGNOSIS — I2510 Atherosclerotic heart disease of native coronary artery without angina pectoris: Secondary | ICD-10-CM

## 2012-10-05 DIAGNOSIS — E785 Hyperlipidemia, unspecified: Secondary | ICD-10-CM

## 2012-10-05 DIAGNOSIS — I10 Essential (primary) hypertension: Secondary | ICD-10-CM

## 2012-10-05 DIAGNOSIS — R079 Chest pain, unspecified: Secondary | ICD-10-CM

## 2012-10-05 HISTORY — PX: PERCUTANEOUS CORONARY STENT INTERVENTION (PCI-S): SHX5485

## 2012-10-05 HISTORY — PX: LEFT HEART CATHETERIZATION WITH CORONARY ANGIOGRAM: SHX5451

## 2012-10-05 LAB — CBC
HCT: 37.2 % (ref 36.0–46.0)
Hemoglobin: 12.1 g/dL (ref 12.0–15.0)
MCH: 29.6 pg (ref 26.0–34.0)
MCHC: 32.5 g/dL (ref 30.0–36.0)
MCV: 91 fL (ref 78.0–100.0)
Platelets: 193 K/uL (ref 150–400)
RBC: 4.09 MIL/uL (ref 3.87–5.11)
RDW: 12.7 % (ref 11.5–15.5)
WBC: 8.3 K/uL (ref 4.0–10.5)

## 2012-10-05 LAB — GLUCOSE, CAPILLARY
Glucose-Capillary: 264 mg/dL — ABNORMAL HIGH (ref 70–99)
Glucose-Capillary: 193 mg/dL — ABNORMAL HIGH (ref 70–99)
Glucose-Capillary: 186 mg/dL — ABNORMAL HIGH (ref 70–99)

## 2012-10-05 LAB — POCT ACTIVATED CLOTTING TIME: Activated Clotting Time: 546 seconds

## 2012-10-05 LAB — HEPARIN LEVEL (UNFRACTIONATED): Heparin Unfractionated: 0.34 IU/mL (ref 0.30–0.70)

## 2012-10-05 SURGERY — LEFT HEART CATHETERIZATION WITH CORONARY ANGIOGRAM
Anesthesia: LOCAL

## 2012-10-05 MED ORDER — NITROGLYCERIN 1 MG/10 ML FOR IR/CATH LAB
INTRA_ARTERIAL | Status: AC
  Filled 2012-10-05: qty 10

## 2012-10-05 MED ORDER — HEPARIN SODIUM (PORCINE) 1000 UNIT/ML IJ SOLN
INTRAMUSCULAR | Status: AC
  Filled 2012-10-05: qty 1

## 2012-10-05 MED ORDER — VERAPAMIL HCL 2.5 MG/ML IV SOLN
INTRAVENOUS | Status: AC
  Filled 2012-10-05: qty 2

## 2012-10-05 MED ORDER — HEPARIN (PORCINE) IN NACL 2-0.9 UNIT/ML-% IJ SOLN
INTRAMUSCULAR | Status: AC
  Filled 2012-10-05: qty 1000

## 2012-10-05 MED ORDER — PRASUGREL HCL 10 MG PO TABS
ORAL_TABLET | ORAL | Status: AC
  Filled 2012-10-05: qty 6

## 2012-10-05 MED ORDER — SODIUM CHLORIDE 0.9 % IV SOLN: 1.0000 mL/kg/h | INTRAVENOUS | Status: AC

## 2012-10-05 MED ORDER — ACETAMINOPHEN 325 MG PO TABS
ORAL_TABLET | ORAL | Status: AC
  Filled 2012-10-05: qty 2

## 2012-10-05 MED ORDER — HYDRALAZINE HCL 20 MG/ML IJ SOLN
10.0000 mg | Freq: Four times a day (QID) | INTRAMUSCULAR | Status: DC | PRN
  Administered 2012-10-05 – 2012-10-06 (×2): 10 mg via INTRAVENOUS
  Filled 2012-10-05: qty 1

## 2012-10-05 MED ORDER — BIVALIRUDIN 250 MG IV SOLR
INTRAVENOUS | Status: AC
  Filled 2012-10-05: qty 250

## 2012-10-05 MED ORDER — PRASUGREL HCL 10 MG PO TABS
10.0000 mg | ORAL_TABLET | Freq: Every day | ORAL | Status: DC
  Administered 2012-10-06: 10:00:00 10 mg via ORAL
  Filled 2012-10-05 (×2): qty 1

## 2012-10-05 MED ORDER — FAMOTIDINE IN NACL 20-0.9 MG/50ML-% IV SOLN
INTRAVENOUS | Status: AC
  Filled 2012-10-05: qty 100

## 2012-10-05 MED ORDER — DIAZEPAM 5 MG PO TABS
5.0000 mg | ORAL_TABLET | Freq: Once | ORAL | Status: AC
  Administered 2012-10-05: 5 mg via ORAL
  Filled 2012-10-05: qty 1

## 2012-10-05 MED ORDER — SODIUM CHLORIDE 0.9 % IV SOLN: INTRAVENOUS | Status: DC

## 2012-10-05 MED ORDER — ASPIRIN 81 MG PO CHEW
81.0000 mg | CHEWABLE_TABLET | Freq: Every day | ORAL | Status: DC
  Administered 2012-10-06: 81 mg via ORAL
  Filled 2012-10-05: qty 1

## 2012-10-05 MED ORDER — LIDOCAINE HCL (PF) 1 % IJ SOLN
INTRAMUSCULAR | Status: AC
  Filled 2012-10-05: qty 30

## 2012-10-05 MED ORDER — MIDAZOLAM HCL 2 MG/2ML IJ SOLN
INTRAMUSCULAR | Status: AC
  Filled 2012-10-05: qty 2

## 2012-10-05 NOTE — Progress Notes (Signed)
     Subjective: This lady has no chest pain overnight.           Physical Exam: Blood pressure 120/71, pulse 73, temperature 98 F (36.7 C), temperature source Oral, resp. rate 18, height 5\' 4"  (1.626 m), weight 97.2 kg (214 lb 4.6 oz), SpO2 94.00%. She looks systemically well. Heart sounds are present and normal. There is no gallop rhythm. Lung fields are clear. She is alert and orientated.   Investigations:  No results found for this or any previous visit (from the past 240 hour(s)).   Basic Metabolic Panel:  Recent Labs  16/10/96 1018 10/04/12 0450  NA 133* 138  K 3.9 3.8  CL 95* 101  CO2 25 27  GLUCOSE 331* 306*  BUN 10 9  CREATININE 0.40* 0.45*  CALCIUM 9.3 8.8   Liver Function Tests:  Recent Labs  10/03/12 1018  AST 32  ALT 31  ALKPHOS 119*  BILITOT 0.5  PROT 7.1  ALBUMIN 3.6     CBC:  Recent Labs  10/03/12 1018 10/04/12 0450 10/05/12 0502  WBC 9.7 8.4 8.3  NEUTROABS 6.7  --   --   HGB 13.2 12.5 12.1  HCT 38.9 37.2 37.2  MCV 88.8 90.7 91.0  PLT 217 214 193    Dg Chest 2 View  10/03/2012   *RADIOLOGY REPORT*  Clinical Data: Chest pain, shortness of breath, cough and chest congestion.  CHEST - 2 VIEW  Comparison: 04/19/2004  Findings: There is slight peribronchial thickening consistent with bronchitis.  Lungs are otherwise clear.  Heart size and vascularity are normal.  No osseous abnormality.  IMPRESSION: Slight bronchitic changes.   Original Report Authenticated By: Francene Boyers, M.D.   US Abdomen Complete  10/04/2012   *RADIOLOGY REPORT*  Clinical Data:  Pancreatitis  COMPLETE ABDOMINAL ULTRASOUND  Comparison:  None.  Findings:  Gallbladder:  Possible sludge in the gallbladder neck, equivocal. No gallstones, gallbladder wall thickening, or pericholecystic fluid.  Negative sonographic Murphy's sign.  Common bile duct:  Measures 4 mm.  Liver:  Hyperechoic hepatic parenchyma, reflecting hepatic steatosis, with focal fatty sparing in the  gallbladder fossa.  IVC:  Appears normal.  Pancreas:  Incompletely visualized but grossly unremarkable.  Spleen:  Splenomegaly, measuring 12.4 x 6.5 x 14.6 cm (calculated volume 615 ml).  Right Kidney:  Measures 13.0 cm.  No mass or hydronephrosis.  Left Kidney:  Measures 12.6 cm.  No mass or hydronephrosis.  Abdominal aorta:  No aneurysm identified.  IMPRESSION: Possible gallbladder sludge, equivocal.  Common duct measures 4 mm, within normal limits.  Hepatic steatosis.  Splenomegaly.   Original Report Authenticated By: Charline Bills, M.D.      Medications: I have reviewed the patient's current medications.  Impression: 1. Ischemic chest pain with mild elevation in troponin. Multiple risk factors for coronary artery disease. 2. Hypertension. 3. Type 2 diabetes mellitus, uncontrolled. 4. Postmenopausal state. 5. Hyperlipidemia. 6. Morbid obesity     Plan: 1. Continue with all medications. 2. Cardiac catheterization at [facility] today.     LOS: 2 days   Wilson Singer Pager [phone]  10/05/2012, 7:35 AM

## 2012-10-05 NOTE — CV Procedure (Signed)
   Cardiac Catheterization Procedure Note  Name: [name] MRN: [id number] DOB: [birth year]  Procedure: Left Heart Cath, Selective Coronary Angiography, LV angiography, PTCA and stenting of the LAD  Indication: 52 yo WF with history of diabetes mellitus, HTN, HL presents with symptoms of unstable angina. One of four troponins are positive.   Procedural Details:  The right wrist was prepped, draped, and anesthetized with 1% lidocaine. Using the modified Seldinger technique, a 5 French sheath was introduced into the right radial artery. 3 mg of verapamil was administered through the sheath, weight-based unfractionated heparin was administered intravenously. Standard Judkins catheters were used for selective coronary angiography and left ventriculography. Catheter exchanges were performed over an exchange length guidewire.  PROCEDURAL FINDINGS Hemodynamics: AO 129/82 mean 104 mm Hg LV 132/22 mm Hg   Coronary angiography: Coronary dominance: right  Left mainstem: Normal  Left anterior descending (LAD): 90% mid LAD with moderate calcification. The distal LAD has diffuse irregularities.  Left circumflex (LCx): less than 10% luminal irregularity  Right coronary artery (RCA): Mild proximal irregularity less than 20%.  Left ventriculography: Left ventricular systolic function is normal, LVEF is estimated at 55%, there is mild mid to distal anterior wall hypokinesis,  there is no significant mitral regurgitation   PCI Note:  Following the diagnostic procedure, the decision was made to proceed with PCI. Weight-based bivalirudin was given for anticoagulation. Effient 60 mg was given orally. Once a therapeutic ACT was achieved, a 6 [place] XBLAD 3.5  guide catheter was inserted.  A prowater coronary guidewire was used to cross the lesion.  The lesion was predilated with a 2.5 mm compliant balloon.  The lesion was then stented with a 3.5 x 28 mm Promus premier stent.  The stent was postdilated  with a 3.75 mm noncompliant balloon.  Following PCI, there was 0% residual stenosis and TIMI-3 flow. Final angiography confirmed an excellent result. The patient tolerated the procedure well. There were no immediate procedural complications. A TR band was used for radial hemostasis. The patient was transferred to the post catheterization recovery area for further monitoring.  PCI Data: Vessel - LAD/Segment - mid Percent Stenosis (pre)  90% TIMI-flow 3 Stent 3.5 x 28 mm Promus premier Percent Stenosis (post) 0% TIMI-flow (post) 3  Final Conclusions:   1. Single vessel obstructive CAD with high grade mid LAD stenosis. 2. Well preserved LV systolic function with anterior hypokinesis. 3. Successful stenting of the mid LAD with DES.   Recommendations:  Continue dual antiplatelet therapy for one year. Risk factor modification.  Theron Arista [facility] 10/05/2012, 2:37 PM

## 2012-10-05 NOTE — Progress Notes (Signed)
SUBJECTIVE:No chest pain overnight. Planned transfer to [facility] for cardiac cath this am.   Basic Metabolic Panel:  Recent Labs  11/91/47 1018 10/04/12 0450  NA 133* 138  K 3.9 3.8  CL 95* 101  CO2 25 27  GLUCOSE 331* 306*  BUN 10 9  CREATININE 0.40* 0.45*  CALCIUM 9.3 8.8   Liver Function Tests:  Recent Labs  10/03/12 1018  AST 32  ALT 31  ALKPHOS 119*  BILITOT 0.5  PROT 7.1  ALBUMIN 3.6    Recent Labs  10/03/12 1712 10/04/12 0836  LIPASE 168* 32   CBC:  Recent Labs  10/03/12 1018 10/04/12 0450 10/05/12 0502  WBC 9.7 8.4 8.3  NEUTROABS 6.7  --   --   HGB 13.2 12.5 12.1  HCT 38.9 37.2 37.2  MCV 88.8 90.7 91.0  PLT 217 214 193   Cardiac Enzymes:  Recent Labs  10/03/12 1712 10/03/12 2324 10/04/12 0450  TROPONINI 0.40* <0.30 <0.30    D-Dimer:  Recent Labs  10/03/12 1018  DDIMER <0.27   Hemoglobin A1C:  Recent Labs  10/03/12 1712  HGBA1C 11.0*   Fasting Lipid Panel:  Recent Labs  10/03/12 1018  CHOL 272*  HDL 50  LDLCALC 186*  TRIG 179*  CHOLHDL 5.4    PHYSICAL EXAM BP 120/71  Pulse 73  Temp(Src) 98 F (36.7 C) (Oral)  Resp 18  Ht 5\' 4"  (1.626 m)  Wt 214 lb 4.6 oz (97.2 kg)  BMI 36.76 kg/m2  SpO2 94% General: Well developed, well nourished, in no acute distress Head: Eyes PERRLA, No xanthomas.   Normal cephalic and atramatic  Lungs: Clear bilaterally to auscultation and percussion. Heart: HRRR S1 S2, No MRG .  Pulses are 2+ & equal.            No carotid bruit. No JVD.  No abdominal bruits. No femoral bruits. Abdomen: Bowel sounds are positive, abdomen soft and non-tender without masses or                  Hernia's noted. Msk:  Back normal, normal gait. Normal strength and tone for age. Extremities: No clubbing, cyanosis or edema.  DP +1 Neuro: Alert and oriented X 3. Psych:  Good affect, responds appropriately  TELEMETRY: Reviewed telemetry pt in: NSR  ASSESSMENT AND PLAN:  1.  Chest Pain None overnight. She is  mildly anxious about cath this am . Will provide one dose of Valium prior to transport to [facility] for cath. More recommendations per cathing physicians post cath.  2. Diabetes: Ongoing treatment. Hbg A1C is elevated. Will need to have better management. Recommend dietary consult post cath or with PCP.  3. Hyperlipidemia: Well controlled with lisinopril, 10 mg daily. Also now on metoprolol 25 mg daily. Creatinine 0.45.   Bettey Mare. Lyman Bishop NP [facility] 10/05/2012, 8:25 AM  Cardiology Attending Patient interviewed and examined. Discussed with Joni Reining, NP.  Above note annotated and modified based upon my findings.  Patient hydrated overnight. No recurrent symptoms.  Risks of cardiac catheterization explained to patient and her husband including stroke, myocardial infarction, acute renal failure and death. They agree to proceed.   Bing, MD 10/05/2012, 5:16 PM

## 2012-10-05 NOTE — Progress Notes (Signed)
ANTICOAGULATION CONSULT NOTE  Pharmacy Consult for Heparin Indication: chest pain/ACS  Allergies  Allergen Reactions  . Codeine Nausea And Vomiting    "deathly sick"  . Demerol (Meperidine)     Pt states all pain meds make her sick on her stomach  . Dilaudid (Hydromorphone Hcl) Nausea And Vomiting  . Morphine And Related Nausea And Vomiting   Patient Measurements: Height: 5\' 4"  (162.6 cm) Weight: 214 lb 4.6 oz (97.2 kg) IBW/kg (Calculated) : 54.7 Heparin Dosing Weight: 79 kg  Vital Signs: Temp: 98 F (36.7 C) (05/23 0426) Temp src: Oral (05/23 0426) BP: 120/71 mmHg (05/23 0426) Pulse Rate: 73 (05/23 0426)  Labs:  Recent Labs  10/03/12 1018 10/03/12 1712 10/03/12 2324 10/04/12 0450  10/04/12 1629 10/04/12 2308 10/05/12 0500 10/05/12 0502  HGB 13.2  --   --  12.5  --   --   --   --  12.1  HCT 38.9  --   --  37.2  --   --   --   --  37.2  PLT 217  --   --  214  --   --   --   --  193  LABPROT  --   --   --   --   --   --   --  14.0  --   INR  --   --   --   --   --   --   --  1.09  --   HEPARINUNFRC  --   --  0.69  --   < > 0.13* 0.15*  --  0.34  CREATININE 0.40*  --   --  0.45*  --   --   --   --   --   TROPONINI <0.30 0.40* <0.30 <0.30  --   --   --   --   --   < > = values in this interval not displayed.  Estimated Creatinine Clearance: 92.1 ml/min (by C-G formula based on Cr of 0.45).  Medical History: Past Medical History  Diagnosis Date  . Hypertension   . Diabetes mellitus type 2 with complications, uncontrolled   . Hyperlipidemia   . Endometriosis     TAH/BSO in 2003  . Breast carcinoma   . Basal cell carcinoma 2013    Resected from left neck  . Obesity    Medications:  Scheduled:  . aspirin EC  325 mg Oral Daily  . atorvastatin  80 mg Oral q1800  . citalopram  40 mg Oral Daily  . insulin aspart  0-15 Units Subcutaneous TID WC  . insulin aspart  0-5 Units Subcutaneous QHS  . insulin detemir  20 Units Subcutaneous Daily  . levothyroxine  125  mcg Oral QAC breakfast  . lisinopril  10 mg Oral Daily  . metoprolol tartrate  25 mg Oral BID  . sodium chloride  3 mL Intravenous Q12H   Assessment: 52 yo F admitted with chest pain.  Initial troponin & EKG were normal, however 2nd set of cardiac enzymes were positive & patient was started on heparin.  She is awaiting stress test.   Initial heparin level was therapeutic, however heparin level trended down to sub-therapeutic level.  Heparin appeared to be infusing correctly and after discussing lab draws with patient will assume 2nd level is most accurate.   Heparin was adjusted.  Heparin level is therapeutic this am.  Pt is reportedly going to [facility] today for cath.  CBC reviewed.  No bleeding noted.   Goal of Therapy:  Heparin level 0.3-0.7 units/ml Monitor platelets by anticoagulation protocol: Yes   Plan:  Continue heparin infusion at current rate Plan transfer to [facility] for cath today Daily heparin level & CBC while on heparin F/U treatment plan  Valrie Hart A 10/05/2012,7:55 AM

## 2012-10-05 NOTE — H&P (View-Only) (Signed)
Patient Name: [name]  MRN: [id number]  HPI: [name] is an 52 y.o. female referred for consultation by Dr.Jehanzeb Memon, MD for chest pain with slightly elevated troponin.  This nice woman has no history of cardiac disease, but does have significant risk factors, most notably postmenopausal status since TAH/BSO approximately 10 years ago, diabetes and hyperlipidemia. Nonetheless, she has never previously been evaluated by a cardiologist, has not testing and has no known vascular or cardiovascular disease. She presented after a number of hours of intermittent chest pressure with mild associated nausea and radiation to the neck. There was some dyspnea, but no diaphoresis nor emesis. She continued to have symptoms in the emergency department, but initial cardiac markers and EKGs were negative. Since starting heparin, symptoms have been relieved. She did not receive nitroglycerin, but was treated with aspirin.  Initial troponin was normal, a second test approximately 24 hours after the onset of symptoms was slightly elevated at 0.4. 2 subsequent tests were again normal. She is found to have gallbladder sludge without evidence of acute cholecystitis on abdominal ultrasound, splenomegaly and mildly elevated lipase on presentation, which normalized within 12 hours.  Past Medical History  Diagnosis Date  . Hypertension   . Diabetes mellitus type 2 with complications, uncontrolled   . Hyperlipidemia   . Endometriosis     TAH/BSO in 2003  . Breast carcinoma   . Basal cell carcinoma 2013    Resected from left neck  . Obesity    Past Surgical History  Procedure Laterality Date  . Mastectomy    . Total abdominal hysterectomy w/ bilateral salpingoophorectomy  2003  . Skin cancer excision  2013    Basal cell-left neck   History reviewed. No pertinent family history.  Social History:  reports that she quit smoking about 23 years ago. Her smoking use included Cigarettes. She has a 15  pack-year smoking history. She does not have any smokeless tobacco history on file. She reports that  drinks alcohol. She reports that she does not use illicit drugs.  Allergies:  Allergies  Allergen Reactions  . Codeine Nausea And Vomiting    "deathly sick"  . Demerol (Meperidine)     Pt states all pain meds make her sick on her stomach  . Dilaudid (Hydromorphone Hcl) Nausea And Vomiting  . Morphine And Related Nausea And Vomiting   Medications:  I have reviewed the patient's current medications. Scheduled: . aspirin EC  325 mg Oral Daily  . atorvastatin  20 mg Oral q1800  . citalopram  40 mg Oral Daily  . insulin detemir  20 Units Subcutaneous Daily  . levothyroxine  125 mcg Oral QAC breakfast  . lisinopril  10 mg Oral Daily  . metoprolol tartrate  25 mg Oral BID     10/03/12 10:18 AM      Result Value Range   WBC 9.7  4.0 - 10.5 K/uL   RBC 4.38  3.87 - 5.11 MIL/uL   Hemoglobin 13.2  12.0 - 15.0 g/dL   HCT 40.9  81.1 - 91.4 %   Platelets 217  150 - 400 K/uL  COMPREHENSIVE METABOLIC PANEL     Status: Abnormal      Result Value Range   Sodium 133 (*) 135 - 145 mEq/L   Potassium 3.9  3.5 - 5.1 mEq/L   Chloride 95 (*) 96 - 112 mEq/L   CO2 25  19 - 32 mEq/L   Glucose, Bld 331 (*) 70 - 99 mg/dL  BUN 10  6 - 23 mg/dL   Creatinine, Ser 1.61 (*) 0.50 - 1.10 mg/dL   Calcium 9.3  8.4 - 09.6 mg/dL   Total Protein 7.1  6.0 - 8.3 g/dL   Albumin 3.6  3.5 - 5.2 g/dL   AST 32  0 - 37 U/L   ALT 31  0 - 35 U/L   Alkaline Phosphatase 119 (*) 39 - 117 U/L   Total Bilirubin 0.5  0.3 - 1.2 mg/dL   GFR calc non Af Amer >90  >90 mL/min   GFR calc Af Amer >90  >90 mL/min   Troponin I <0.30  <0.30 ng/mL   D-Dimer, Quant <0.27  0.00 - 0.48 ug/mL-FEU  LIPID PANEL     Status: Abnormal      Result Value Range   Cholesterol 272 (*) 0 - 200 mg/dL   Triglycerides 045 (*) <150 mg/dL   HDL 50  >40 mg/dL   Total CHOL/HDL Ratio 5.4     VLDL 36  0 - 40 mg/dL   LDL Cholesterol 981 (*) 0 - 99  mg/dL      Result Value Range   Troponin I 0.40 (*) <0.30 ng/mL   Hemoglobin A1C 11.0 (*) <5.7 %   Lipase 168 (*) 11 - 59 U/L   Troponin I <0.30  <0.30 ng/mL   Troponin I <0.30  <0.30 ng/mL   Lipase 32  11 - 59 U/L   CXR  10/03/12  Slight peribronchial thickening consistent with bronchitis.  Lungs are otherwise clear.  Heart size and vascularity are normal.    US Abdomen Complete  10/04/2012   Possible gallbladder sludge, equivocal. Hepatic steatosis.  Splenomegaly.     Echocardiogram 10/04/12: Mild LVH; nl EF, no significant valvular abnormalities.  Review of Systems: General: no anorexia, weight gain or weight loss Cardiac: no chest pain, dyspnea, orthopnea, PND,  or syncope Respiratory: no cough, sputum production or hemoptysis GI: no nausea, abdominal pain, emesis, diarrhea or constipation Integument: no significant lesions Neurologic: No muscle weakness or paralysis; no speech disturbance; no headache All other systems reviewed and are negative.  Physical Exam: Blood pressure 119/69, pulse 80, temperature 97.8 F (36.6 C), temperature source Oral, resp. rate 18, height 5\' 4"  (1.626 m), weight 99.791 kg (220 lb), SpO2 95.00%.;  Body mass index is 37.74 kg/(m^2). General-Well-developed; no acute distress HEENT-[place]/AT; PERRL; EOM intact; conjunctiva and lids nl Neck-No JVD; no carotid bruits Endocrine-No thyromegaly Lungs-Clear lung fields; resonant percussion; normal I-to-E ratio Cardiovascular- normal PMI; normal S1 and S2 Abdomen-BS normal; soft and non-tender without masses or organomegaly Musculoskeletal-No deformities, cyanosis or clubbing Neurologic-Nl cranial nerves; symmetric strength and tone Skin- Warm, no significant lesions Extremities-Nl distal pulses; no edema  Assessment/Plan:  Patient Active Problem List   Diagnosis Date Noted  . Chest pain 10/03/2012  . Essential hypertension, benign 10/03/2012  . Type II or unspecified type diabetes mellitus without  mention of complication, not stated as uncontrolled 10/03/2012  . Hypothyroidism 10/03/2012  . Morbid obesity 10/03/2012   Chest Pain: Description of symptoms is certainly consistent with an ischemic etiology. Although there is no evidence on physical examination for vascular disease, she does have very significant risk factors. Absence of EKG abnormalities during discomfort is reassuring, but mild elevation in troponin is not. A stress test would not provide adequate reassurance of the absence of coronary disease in the setting, and I've recommended coronary angiography. Risks and benefits described patient and her husband. They agree to proceed. She  will be transported to [facility] in the morning. Aspirin and heparin will be continued overnight. Should she report recurrent symptoms, intravenous nitroglycerin should be started and arrangements made for more urgent transfer.  Diabetes mellitus: A1c level of 11 suggests suboptimal control.  Tobacco abuse in remission: Remote history of tobacco use is probably not sure main to current problems.  Hyperlipidemia: If she does prove to have coronary disease, pharmacologic treatment will certainly be warranted. In the setting of diabetes and multiple risk factors, therapy will be initiated.  Hypertension: Presence of LVH suggest significant blood pressure elevation in the past.  Control has been good since hospital admission.  [place] Bing, MD 10/04/2012, 4:52 PM

## 2012-10-05 NOTE — Progress Notes (Signed)
Patient transferred via [facility] to [facility] lab [facility], report given to [facility] with [facility].   VSS stable at this time and IV WNL.  Patients family aware of transfer.

## 2012-10-05 NOTE — Progress Notes (Signed)
TR BAND REMOVAL  LOCATION:    right radial  DEFLATED PER PROTOCOL:    yes  TIME BAND OFF / DRESSING APPLIED:    1730   SITE UPON ARRIVAL:    Level 0  SITE AFTER BAND REMOVAL:    Level 0  REVERSE ALLEN'S TEST:     positive  CIRCULATION SENSATION AND MOVEMENT:    Within Normal Limits   yes  COMMENTS:   Tolerated procedure well 

## 2012-10-05 NOTE — Interval H&P Note (Signed)
History and Physical Interval Note:  10/05/2012 1:32 PM  [name]  has presented today for surgery, with the diagnosis of Chest pain  The various methods of treatment have been discussed with the patient and family. After consideration of risks, benefits and other options for treatment, the patient has consented to  Procedure(s): LEFT HEART CATHETERIZATION WITH CORONARY ANGIOGRAM (N/A) as a surgical intervention .  The patient's history has been reviewed, patient examined, no change in status, stable for surgery.  I have reviewed the patient's chart and labs.  Questions were answered to the patient's satisfaction.     Theron Arista [facility] 10/05/2012 1:32 PM

## 2012-10-06 DIAGNOSIS — I10 Essential (primary) hypertension: Secondary | ICD-10-CM

## 2012-10-06 DIAGNOSIS — I214 Non-ST elevation (NSTEMI) myocardial infarction: Secondary | ICD-10-CM

## 2012-10-06 LAB — CBC
Hemoglobin: 12 g/dL (ref 12.0–15.0)
Platelets: 213 10*3/uL (ref 150–400)
RBC: 4.04 MIL/uL (ref 3.87–5.11)

## 2012-10-06 LAB — BASIC METABOLIC PANEL
CO2: 23 mEq/L (ref 19–32)
Calcium: 9.2 mg/dL (ref 8.4–10.5)
GFR calc non Af Amer: >90 mL/min (ref >90)
Glucose, Bld: 240 mg/dL — ABNORMAL HIGH (ref 70–99)
Potassium: 3.8 mEq/L (ref 3.5–5.1)
Sodium: 139 mEq/L (ref 135–145)

## 2012-10-06 LAB — GLUCOSE, CAPILLARY: Glucose-Capillary: 221 mg/dL — ABNORMAL HIGH (ref 70–99)

## 2012-10-06 MED ORDER — METOPROLOL TARTRATE 25 MG PO TABS: 25.0000 mg | ORAL_TABLET | Freq: Two times a day (BID) | ORAL | Status: DC

## 2012-10-06 MED ORDER — LIVING WELL WITH DIABETES BOOK
Freq: Once | Status: AC
  Administered 2012-10-06: 06:00:00
  Filled 2012-10-06: qty 1

## 2012-10-06 MED ORDER — ASPIRIN 81 MG PO CHEW: 81.0000 mg | CHEWABLE_TABLET | Freq: Every day | ORAL | Status: DC

## 2012-10-06 MED ORDER — PRASUGREL HCL 10 MG PO TABS: 10.0000 mg | ORAL_TABLET | Freq: Every day | ORAL | Status: DC

## 2012-10-06 MED ORDER — ATORVASTATIN CALCIUM 80 MG PO TABS: 80.0000 mg | ORAL_TABLET | Freq: Every day | ORAL | Status: DC

## 2012-10-06 MED ORDER — NITROGLYCERIN 0.4 MG SL SUBL: 0.4000 mg | SUBLINGUAL_TABLET | SUBLINGUAL | Status: DC | PRN

## 2012-10-06 MED ORDER — ACETAMINOPHEN 500 MG PO TABS
1000.0000 mg | ORAL_TABLET | Freq: Four times a day (QID) | ORAL | Status: DC | PRN
  Administered 2012-10-06: 09:00:00 1000 mg via ORAL
  Filled 2012-10-06: qty 2

## 2012-10-06 MED ORDER — ACTIVE PARTNERSHIP FOR HEALTH OF YOUR HEART BOOK
Freq: Once | Status: AC
  Administered 2012-10-06: 06:00:00
  Filled 2012-10-06: qty 1

## 2012-10-06 NOTE — Progress Notes (Signed)
[id number] Pt with headache and nausea. Just received pain med. Will educate pt but not ambulate at this time. Pt can walk with RN prior to d/c if she feels better. Pt has not been watching her diabetic diet closely, drinking regular soft drinks and has not been to outpatient diabetic classes. Discussed counting carbs and gave handout. Went over breakfast menu with pt so she could see how we are counting carbs here. Encouraged pt to f/up with medical doctor ASAP with HGA1C 11.0 and asking him about diabetic outpatient classes. Discussed CRP 2 and pt gave permission to refer to [facility]. Education completed when pt became nauseated. Assisted to bathroom and notified RN. Went over briefly ed with husband. Handouts on diet and ex given.Luetta Nutting RNBSN

## 2012-10-06 NOTE — Discharge Summary (Signed)
Physician Discharge Summary  Patient ID: [name] MRN: [id number] DOB/AGE: May 24, 1960 52 y.o.  Admit date: 10/03/2012 Discharge date: 10/06/2012  Primary Discharge Diagnosis 1. CAD-s/p DES to mid LAD  Secondary Discharge Diagnosis  1. Diabetes 2. Hyperlipidemia  Significant Diagnostic Studies:  1. Cardiac Cath 5.23.2014 Coronary angiography:  Coronary dominance: right  Left mainstem: Normal  Left anterior descending (LAD): 90% mid LAD with moderate calcification. The distal LAD has diffuse irregularities.  Left circumflex (LCx): less than 10% luminal irregularity  Right coronary artery (RCA): Mild proximal irregularity less than 20%.  Left ventriculography: Left ventricular systolic function is normal, LVEF is estimated at 55%, there is mild mid to distal anterior wall hypokinesis, there is no significant mitral regurgitation   2. PCI PCI Data:  Vessel - LAD/Segment - mid  Percent Stenosis (pre) 90%  TIMI-flow 3  Stent 3.5 x 28 mm Promus premier  Percent Stenosis (post) 0%  TIMI-flow (post) 3  Consults: None  Hospital Course:  [name] is a 52 year old patient admitted to [facility] with recurrent chest pain and slightly elevated cardiac enzymes. She complained of intermittent chest pressure with associated nausea and radiating to the neck with associated dyspnea. The patient was seen by Dr. Dietrich Pates at [facility] and deemed a candidate for cardiac catheterization and therefore transferred to [facility] on 10/05/2012.  The patient underwent left heart catheterization resulting in PCI with drug-eluting stent to the LAD. The patient subsequently placed on dual antiplatelet therapy, statin dose was increased to 80 mg daily. The patient was seen and examined by Dr. Maui Bing and found to be stable for discharge. Followup appointment we made in our [place] office for ongoing management.  Discharge Exam: Blood pressure 178/65, pulse 76,  temperature 97.9 F (36.6 C), temperature source Oral, resp. rate 17, height 5\' 4"  (1.626 m), weight 216 lb 0.8 oz (98 kg), SpO2 97.00%. General: Well developed, well nourished, in no acute distress Head: Eyes PERRLA, No xanthomas.   Normal cephalic and atramatic  Lungs: Clear bilaterally to auscultation and percussion. Heart: HRRR S1 S2, without MRG.  Pulses are 2+ & equal.            No carotid bruit. No JVD.  No abdominal bruits. No femoral bruits. Abdomen: Bowel sounds are positive, abdomen soft and non-tender without masses . Msk:  Back normal, normal gait. Normal strength and tone for age. Extremities: No clubbing, cyanosis or edema. Right wrist radial insertion site with mild brusing noted. No pain.  DP +1 Neuro: Alert and oriented X 3. Psych:  Good affect, responds appropriately   Lab Results  Component Value Date   WBC 8.3 10/06/2012   HGB 12.0 10/06/2012   HCT 35.8* 10/06/2012   MCV 88.6 10/06/2012   PLT 213 10/06/2012    Recent Labs Lab 10/03/12 1018  10/06/12 0539  NA 133*  < > 139  K 3.9  < > 3.8  CL 95*  < > 104  CO2 25  < > 23  BUN 10  < > 8  CREATININE 0.40*  < > 0.42*  CALCIUM 9.3  < > 9.2  PROT 7.1  --   --   BILITOT 0.5  --   --   ALKPHOS 119*  --   --   ALT 31  --   --   AST 32  --   --   GLUCOSE 331*  < > 240*  < > = values in this interval not  displayed.   Lab Results  Component Value Date   CHOL 272* 10/03/2012   HDL 50 10/03/2012   LDLCALC 186* 10/03/2012   TRIG 179* 10/03/2012   CHOLHDL 5.4 10/03/2012   Radiology: Dg Chest 2 View  10/03/2012   *RADIOLOGY REPORT*  Clinical Data: Chest pain, shortness of breath, cough and chest congestion.  CHEST - 2 VIEW  Comparison: 04/19/2004  Findings: There is slight peribronchial thickening consistent with bronchitis.  Lungs are otherwise clear.  Heart size and vascularity are normal.  No osseous abnormality.  IMPRESSION: Slight bronchitic changes.   Original Report Authenticated By: Francene Boyers, M.D.   US  Abdomen Complete  EKG:NSR with anterior T-wave abnormalities.  FOLLOW UP PLANS AND APPOINTMENTS     Discharge Orders   Future Orders Complete By Expires     Diet - low sodium heart healthy  As directed     Discharge instructions  As directed     Comments:      Watch wrist for swelling, bleeding or infection. Call MD for symptoms.    Increase activity slowly  As directed         Medication List    TAKE these medications       aspirin 81 MG chewable tablet  Chew 1 tablet (81 mg total) by mouth daily.     atorvastatin 80 MG tablet  Commonly known as:  LIPITOR  Take 1 tablet (80 mg total) by mouth daily at 6 PM.     citalopram 40 MG tablet  Commonly known as:  CELEXA  Take 40 mg by mouth daily.     levothyroxine 125 MCG tablet  Commonly known as:  SYNTHROID, LEVOTHROID  Take 125 mcg by mouth daily before breakfast.     lisinopril 10 MG tablet  Commonly known as:  PRINIVIL,ZESTRIL  Take 10 mg by mouth daily.     metFORMIN 500 MG 24 hr tablet  Commonly known as:  GLUCOPHAGE-XR  Take 1,500 mg by mouth daily with breakfast.     metoprolol tartrate 25 MG tablet  Commonly known as:  LOPRESSOR  Take 1 tablet (25 mg total) by mouth 2 (two) times daily.     nitroGLYCERIN 0.4 MG SL tablet  Commonly known as:  NITROSTAT  Place 1 tablet (0.4 mg total) under the tongue every 5 (five) minutes as needed for chest pain.     prasugrel 10 MG Tabs  Commonly known as:  EFFIENT  Take 1 tablet (10 mg total) by mouth daily.       Follow-up Information   Follow up with Batavia Bing, MD. (Our office will call you for appointment)    Contact information:   [address] [place] [address] [phone]     Time spent with patient to include physician time:35 minutes  Joni Reining 10/06/2012, 9:01 AM  Cardiology Attending Patient interviewed and examined. Discussed with Joni Reining, NP.  Above note annotated and modified based upon my findings. Patient doing  very well following single vessel intervention with a drug-eluting stent. Critical importance of uninterrupted treatment with dual platelet therapy explained to her and her husband.  Blood pressure has been somewhat elevated in hospital. Patient will continue to monitor, and antihypertensive therapy will be adjusted at her next office visit if necessary.   Bing, MD 10/06/2012, 2:59 PM

## 2012-10-09 MED FILL — Sodium Chloride IV Soln 0.9%: INTRAVENOUS | Qty: 50 | Status: AC

## 2012-10-18 NOTE — Progress Notes (Signed)
HPI: [name] is a 52 year old patient of Dr. [place] Bing we are following for ongoing assessment and management of known CAD, status post drug-eluting stent to the mid LAD on 10/05/2012. She had non-structure disease elsewhere with an LV EF of 55%. The patient complain of intermittent chest pressure with associated nausea and radiating to the neck with associated dyspnea. The patient was placed on a statin with increased dose to 80 mg daily and DAPT with ASA and Prasurgrel. He did have mildly elevated blood pressure during hospitalization. She is here for followup for ongoing evaluation.   Since discharge she experienced nausea on atorvatstatin, and has now been changed to simvastatin by PCP. Decreased metoprolol to 25 mg once a day. Is being well tolerated now. She is doing well and slowly increasing activity. No chest pain. Some fatigue.       Allergies  Allergen Reactions  . Codeine Nausea And Vomiting    "deathly sick"  . Demerol (Meperidine)     Pt states all pain meds make her sick on her stomach  . Dilaudid (Hydromorphone Hcl) Nausea And Vomiting  . Morphine And Related Nausea And Vomiting    Current Outpatient Prescriptions  Medication Sig Dispense Refill  . aspirin 81 MG chewable tablet Chew 1 tablet (81 mg total) by mouth daily.      . citalopram (CELEXA) 40 MG tablet Take 40 mg by mouth daily.      [name] levothyroxine (SYNTHROID, LEVOTHROID) 125 MCG tablet Take 125 mcg by mouth daily before breakfast.      . lisinopril (PRINIVIL,ZESTRIL) 10 MG tablet Take 10 mg by mouth daily.      . metFORMIN (GLUCOPHAGE-XR) 500 MG 24 hr tablet Take 1,500 mg by mouth daily with breakfast.      . metoprolol tartrate (LOPRESSOR) 25 MG tablet Take 25 mg by mouth daily.      . nitroGLYCERIN (NITROSTAT) 0.4 MG SL tablet Place 1 tablet (0.4 mg total) under the tongue every 5 (five) minutes as needed for chest pain.  30 tablet  12  . prasugrel (EFFIENT) 10 MG TABS Take 1 tablet (10 mg total) by  mouth daily.  30 tablet  11  . simvastatin (ZOCOR) 10 MG tablet Take 10 mg by mouth at bedtime.       No current facility-administered medications for this visit.    Past Medical History  Diagnosis Date  . Hypertension   . Diabetes mellitus type 2 with complications, uncontrolled   . Hyperlipidemia   . Endometriosis     TAH/BSO in 2003  . Breast carcinoma   . Basal cell carcinoma 2013    Resected from left neck  . Obesity     Past Surgical History  Procedure Laterality Date  . Mastectomy    . Total abdominal hysterectomy w/ bilateral salpingoophorectomy  2003  . Skin cancer excision  2013    Basal cell-left neck    ZOX:WRUEAV of systems complete and found to be negative unless listed above  PHYSICAL EXAM BP 112/72  Pulse 75  Ht 5\' 4"  (1.626 m)  Wt 208 lb 12.8 oz (94.711 kg)  BMI 35.82 kg/m2  General: Well developed, well nourished, in no acute distress, obsese Head: Eyes PERRLA, No xanthomas.   Normal cephalic and atramatic  Lungs: Clear bilaterally to auscultation and percussion. Heart: HRRR S1 S2, without MRG.  Pulses are 2+ & equal.            No carotid bruit. No JVD.  No abdominal bruits. No femoral bruits. Abdomen: Bowel sounds are positive, abdomen soft and non-tender without masses or                  Hernia's noted. Msk:  Back normal, normal gait. Normal strength and tone for age. Extremities: No clubbing, cyanosis or edema with good healing at cath site in right wrist..  DP +1 Neuro: Alert and oriented X 3. Psych:  Good affect, responds appropriately  EKG:NSR rate of 75 bpm  ASSESSMENT AND PLAN

## 2012-10-19 ENCOUNTER — Ambulatory Visit (INDEPENDENT_AMBULATORY_CARE_PROVIDER_SITE_OTHER): Payer: 59 | Admitting: Adult Health

## 2012-10-19 ENCOUNTER — Encounter: Payer: Self-pay | Admitting: Adult Health

## 2012-10-19 VITALS — BP 112/72 | HR 75 | Ht 64.0 in | Wt 208.8 lb

## 2012-10-19 DIAGNOSIS — I10 Essential (primary) hypertension: Secondary | ICD-10-CM

## 2012-10-19 DIAGNOSIS — I214 Non-ST elevation (NSTEMI) myocardial infarction: Secondary | ICD-10-CM

## 2012-10-19 DIAGNOSIS — E785 Hyperlipidemia, unspecified: Secondary | ICD-10-CM

## 2012-10-19 NOTE — Assessment & Plan Note (Signed)
She is doing very well without complaints. Medications have been adjusted by PCP to decrease metoprolol to 25 mg daily and change to simvastatin due to nausea.  She is tolerating the medications now without further symptoms. She will be referred to cardiac rehab Will continue risk management. See her again in 3 months. She will call us for any recurrent symptoms.

## 2012-10-19 NOTE — Patient Instructions (Signed)
You have been referred to [facility]iac [facility]  Your physician recommends that you schedule a follow-up appointment in: 3 MONTHS  Your physician recommends that you continue on your current medications as directed. Please refer to the Current Medication list given to you today.

## 2012-10-19 NOTE — Assessment & Plan Note (Signed)
Now on simvastatin as she could not tolerate atorvastatin due to nausea. Will check lipids in 6 months.

## 2012-10-19 NOTE — Addendum Note (Signed)
Addended by: Iona Coach on: 10/19/2012 02:00 PM   Modules accepted: Orders

## 2012-10-19 NOTE — Assessment & Plan Note (Signed)
Well controlled currently. No changes.

## 2012-11-13 ENCOUNTER — Encounter (HOSPITAL_COMMUNITY): Payer: 59

## 2013-02-07 ENCOUNTER — Ambulatory Visit (INDEPENDENT_AMBULATORY_CARE_PROVIDER_SITE_OTHER): Payer: 59 | Admitting: Adult Health

## 2013-02-07 ENCOUNTER — Encounter: Payer: Self-pay | Admitting: Adult Health

## 2013-02-07 VITALS — BP 114/79 | HR 67 | Ht 60.0 in | Wt 201.0 lb

## 2013-02-07 DIAGNOSIS — E785 Hyperlipidemia, unspecified: Secondary | ICD-10-CM

## 2013-02-07 DIAGNOSIS — I10 Essential (primary) hypertension: Secondary | ICD-10-CM

## 2013-02-07 DIAGNOSIS — I214 Non-ST elevation (NSTEMI) myocardial infarction: Secondary | ICD-10-CM

## 2013-02-07 NOTE — Progress Notes (Signed)
HPI: [name] is a 52 year old former patient of Dr. Dietrich Pates we are following for ongoing assessment and management of known CAD, status post drug-eluting stent to the mid LAD on 10/05/2012. She had non-obstructive disease elsewhere with a normal LVEF. On followup visit in June of 2014 the patient complained of nausea on after atorvastatin and was changed to simvastatin by PCP. Also the patient's metoprolol was decreased to 25 mg daily. At that office visit she was without complaint. She remains on Effient and aspirin daily   She comes today feeling well, but states that she occasionally has some chest pressure. She owns her own business, and is under a lot of stress. She also drinks a fair amount of caffeine during the day. She states it is not similar to any kind of discomfort she had prior to having the stent placement. Otherwise, the patient has been medically compliant and has been able to walk every other day for approximately 30 minutes without discomfort..  Allergies  Allergen Reactions  . Codeine Nausea And Vomiting    "deathly sick"  . Demerol [Meperidine]     Pt states all pain meds make her sick on her stomach  . Dilaudid [Hydromorphone Hcl] Nausea And Vomiting  . Morphine And Related Nausea And Vomiting    Current Outpatient Prescriptions  Medication Sig Dispense Refill  . aspirin 81 MG chewable tablet Chew 1 tablet (81 mg total) by mouth daily.      [name] atorvastatin (LIPITOR) 80 MG tablet Take 80 mg by mouth daily.       . citalopram (CELEXA) 40 MG tablet Take 40 mg by mouth daily.      [name] levothyroxine (SYNTHROID, LEVOTHROID) 125 MCG tablet Take 125 mcg by mouth daily before breakfast.      . lisinopril (PRINIVIL,ZESTRIL) 10 MG tablet Take 10 mg by mouth daily.      . metFORMIN (GLUCOPHAGE-XR) 500 MG 24 hr tablet Take 1,500 mg by mouth daily with breakfast.      . metoprolol tartrate (LOPRESSOR) 25 MG tablet Take 25 mg by mouth daily.      . nitroGLYCERIN (NITROSTAT) 0.4 MG  SL tablet Place 1 tablet (0.4 mg total) under the tongue every 5 (five) minutes as needed for chest pain.  30 tablet  12  . prasugrel (EFFIENT) 10 MG TABS Take 1 tablet (10 mg total) by mouth daily.  30 tablet  11   No current facility-administered medications for this visit.    Past Medical History  Diagnosis Date  . Hypertension   . Diabetes mellitus type 2 with complications, uncontrolled   . Hyperlipidemia   . Endometriosis     TAH/BSO in 2003  . Breast carcinoma   . Basal cell carcinoma 2013    Resected from left neck  . Obesity     Past Surgical History  Procedure Laterality Date  . Mastectomy    . Total abdominal hysterectomy w/ bilateral salpingoophorectomy  2003  . Skin cancer excision  2013    Basal cell-left neck    WUJ:WJXBJY of systems complete and found to be negative unless listed above  PHYSICAL EXAM BP 114/79  Pulse 67  Ht 5' (1.524 m)  Wt 201 lb (91.173 kg)  BMI 39.26 kg/m2  General: Well developed, well nourished, in no acute distress Head: Eyes PERRLA, No xanthomas.   Normal cephalic and atramatic  Lungs: Clear bilaterally to auscultation and percussion. Heart: HRRR S1 S2, without MRG.  Pulses are 2+ & equal.  No carotid bruit. No JVD.  No abdominal bruits. No femoral bruits. Abdomen: Bowel sounds are positive, abdomen, obese, soft and non-tender without masses or                  Hernia's noted. Msk:  Back normal, normal gait. Normal strength and tone for age. Extremities: No clubbing, cyanosis or edema.  DP +1 Neuro: Alert and oriented X 3. Psych:  Good affect, responds appropriately    ASSESSMENT AND PLAN

## 2013-02-07 NOTE — Assessment & Plan Note (Signed)
She is recovering well. She has not been able to afford cardiac rehabilitation, but is walking with a friend of hers who is a Psychologist, educational, every other day. She states the friend is doing this for free. She plans on losing weight and being more active. She denies any associated discomfort in her chest during exertion. She states she occasionally has some pressure in her chest wall at work which she is attributing now to caffeine and stress. She is going to try and balance her life a little more and she is working 7 days a week, and she owns her own business. She is considering taking a day off or more during the week to help her feel less burdened. I have encouraged her to do this.

## 2013-02-07 NOTE — Patient Instructions (Addendum)
Your physician recommends that you schedule a follow-up appointment in: 3 months with Joni Reining, NP  Your physician recommends that you continue on your current medications as directed. Please refer to the Current Medication list given to you today.

## 2013-02-07 NOTE — Progress Notes (Deleted)
Name: [name]    DOB: May 29, 1960  Age: 52 y.o.  MR#: [id number]       PCP:  Estanislado Pandy, MD      Insurance: Payor: Cleatrice Burke / Plan: Advertising copywriter / Product Type: *No Product type* /   CC:    Chief Complaint  Patient presents with  . Coronary Artery Disease  . Hypertension    VS Filed Vitals:   02/07/13 1411  BP: 114/79  Pulse: 67  Height: 5' (1.524 m)  Weight: 201 lb (91.173 kg)    Weights Current Weight  02/07/13 201 lb (91.173 kg)  10/19/12 208 lb 12.8 oz (94.711 kg)  10/06/12 216 lb 0.8 oz (98 kg)    Blood Pressure  BP Readings from Last 3 Encounters:  02/07/13 114/79  10/19/12 112/72  10/06/12 178/65     Admit date:  (Not on file) Last encounter with RMR:  10/19/2012   Allergy Codeine; Demerol; Dilaudid; and Morphine and related  Current Outpatient Prescriptions  Medication Sig Dispense Refill  . aspirin 81 MG chewable tablet Chew 1 tablet (81 mg total) by mouth daily.      [name] atorvastatin (LIPITOR) 80 MG tablet Take 80 mg by mouth daily.       . citalopram (CELEXA) 40 MG tablet Take 40 mg by mouth daily.      [name] levothyroxine (SYNTHROID, LEVOTHROID) 125 MCG tablet Take 125 mcg by mouth daily before breakfast.      . lisinopril (PRINIVIL,ZESTRIL) 10 MG tablet Take 10 mg by mouth daily.      . metFORMIN (GLUCOPHAGE-XR) 500 MG 24 hr tablet Take 1,500 mg by mouth daily with breakfast.      . metoprolol tartrate (LOPRESSOR) 25 MG tablet Take 25 mg by mouth daily.      . nitroGLYCERIN (NITROSTAT) 0.4 MG SL tablet Place 1 tablet (0.4 mg total) under the tongue every 5 (five) minutes as needed for chest pain.  30 tablet  12  . prasugrel (EFFIENT) 10 MG TABS Take 1 tablet (10 mg total) by mouth daily.  30 tablet  11   No current facility-administered medications for this visit.    Discontinued Meds:    Medications Discontinued During This Encounter  Medication Reason  . simvastatin (ZOCOR) 10 MG tablet Error    Patient Active Problem List    Diagnosis Date Noted  . Hyperlipidemia 10/04/2012  . Breast carcinoma 10/04/2012  . Surgical menopause 10/04/2012  . Non-STEMI (non-ST elevated myocardial infarction) 10/03/2012  . Hypertension 10/03/2012  . Diabetes mellitus type 2, uncontrolled, with complications 10/03/2012  . Hypothyroidism 10/03/2012  . Morbid obesity 10/03/2012    LABS    Component Value Date/Time   NA 139 10/06/2012 0539   NA 138 10/04/2012 0450   NA 133* 10/03/2012 1018   K 3.8 10/06/2012 0539   K 3.8 10/04/2012 0450   K 3.9 10/03/2012 1018   CL 104 10/06/2012 0539   CL 101 10/04/2012 0450   CL 95* 10/03/2012 1018   CO2 23 10/06/2012 0539   CO2 27 10/04/2012 0450   CO2 25 10/03/2012 1018   GLUCOSE 240* 10/06/2012 0539   GLUCOSE 306* 10/04/2012 0450   GLUCOSE 331* 10/03/2012 1018   BUN 8 10/06/2012 0539   BUN 9 10/04/2012 0450   BUN 10 10/03/2012 1018   CREATININE 0.42* 10/06/2012 0539   CREATININE 0.45* 10/04/2012 0450   CREATININE 0.40* 10/03/2012 1018   CALCIUM 9.2 10/06/2012 0539   CALCIUM 8.8 10/04/2012 0450  CALCIUM 9.3 10/03/2012 1018   GFRNONAA >90 10/06/2012 0539   GFRNONAA >90 10/04/2012 0450   GFRNONAA >90 10/03/2012 1018   GFRAA >90 10/06/2012 0539   GFRAA >90 10/04/2012 0450   GFRAA >90 10/03/2012 1018   CMP     Component Value Date/Time   NA 139 10/06/2012 0539   K 3.8 10/06/2012 0539   CL 104 10/06/2012 0539   CO2 23 10/06/2012 0539   GLUCOSE 240* 10/06/2012 0539   BUN 8 10/06/2012 0539   CREATININE 0.42* 10/06/2012 0539   CALCIUM 9.2 10/06/2012 0539   PROT 7.1 10/03/2012 1018   ALBUMIN 3.6 10/03/2012 1018   AST 32 10/03/2012 1018   ALT 31 10/03/2012 1018   ALKPHOS 119* 10/03/2012 1018   BILITOT 0.5 10/03/2012 1018   GFRNONAA >90 10/06/2012 0539   GFRAA >90 10/06/2012 0539       Component Value Date/Time   WBC 8.3 10/06/2012 0539   WBC 8.3 10/05/2012 0502   WBC 8.4 10/04/2012 0450   HGB 12.0 10/06/2012 0539   HGB 12.1 10/05/2012 0502   HGB 12.5 10/04/2012 0450   HCT 35.8* 10/06/2012 0539   HCT 37.2  10/05/2012 0502   HCT 37.2 10/04/2012 0450   MCV 88.6 10/06/2012 0539   MCV 91.0 10/05/2012 0502   MCV 90.7 10/04/2012 0450    Lipid Panel     Component Value Date/Time   CHOL 272* 10/03/2012 1018   TRIG 179* 10/03/2012 1018   HDL 50 10/03/2012 1018   CHOLHDL 5.4 10/03/2012 1018   VLDL 36 10/03/2012 1018   LDLCALC 186* 10/03/2012 1018    ABG No results found for this basename: phart, pco2, pco2art, po2, po2art, hco3, tco2, acidbasedef, o2sat     No results found for this basename: TSH   BNP (last 3 results) No results found for this basename: PROBNP,  in the last 8760 hours Cardiac Panel (last 3 results) No results found for this basename: CKTOTAL, CKMB, TROPONINI, RELINDX,  in the last 72 hours  Iron/TIBC/Ferritin No results found for this basename: iron, tibc, ferritin     EKG Orders placed in visit on 10/19/12  . EKG 12-LEAD     Prior Assessment and Plan Problem List as of 02/07/2013   Non-STEMI (non-ST elevated myocardial infarction)   Last Assessment & Plan   10/19/2012 Office Visit Written 10/19/2012  1:55 PM by Jodelle Gross, NP     She is doing very well without complaints. Medications have been adjusted by PCP to decrease metoprolol to 25 mg daily and change to simvastatin due to nausea.  She is tolerating the medications now without further symptoms. She will be referred to cardiac rehab Will continue risk management. See her again in 3 months. She will call us for any recurrent symptoms.    Hypertension   Last Assessment & Plan   10/19/2012 Office Visit Written 10/19/2012  1:55 PM by Jodelle Gross, NP     Well controlled currently. No changes.    Diabetes mellitus type 2, uncontrolled, with complications   Hypothyroidism   Morbid obesity   Hyperlipidemia   Last Assessment & Plan   10/19/2012 Office Visit Written 10/19/2012  1:56 PM by Jodelle Gross, NP     Now on simvastatin as she could not tolerate atorvastatin due to nausea. Will check lipids in 6 months.     Breast carcinoma   Surgical menopause       Imaging: No results found.

## 2013-02-07 NOTE — Assessment & Plan Note (Signed)
Blood pressure is currently well-controlled. She denies any dizziness or near syncope. She will continue on current medication regimen without changes.

## 2013-02-07 NOTE — Assessment & Plan Note (Signed)
1 a followup labs completed in 3 months, or can be completed by primary care physician Dr. Neita Carp. She will remain on Lipitor 80 mg daily. She has no complaints of myalgias.

## 2013-10-06 ENCOUNTER — Other Ambulatory Visit: Payer: Self-pay | Admitting: Adult Health

## 2014-01-07 ENCOUNTER — Other Ambulatory Visit: Payer: Self-pay | Admitting: Adult Health

## 2014-02-06 ENCOUNTER — Other Ambulatory Visit: Payer: Self-pay | Admitting: Adult Health

## 2014-02-06 NOTE — Telephone Encounter (Signed)
Pt has not been seen in a year. Needs appt for further refills

## 2014-02-13 ENCOUNTER — Ambulatory Visit (INDEPENDENT_AMBULATORY_CARE_PROVIDER_SITE_OTHER): Payer: 59 | Admitting: Cardiology

## 2014-02-13 ENCOUNTER — Encounter: Payer: Self-pay | Admitting: Cardiology

## 2014-02-13 VITALS — BP 110/64 | HR 77 | Ht 64.0 in | Wt 210.0 lb

## 2014-02-13 DIAGNOSIS — I10 Essential (primary) hypertension: Secondary | ICD-10-CM

## 2014-02-13 DIAGNOSIS — E119 Type 2 diabetes mellitus without complications: Secondary | ICD-10-CM

## 2014-02-13 DIAGNOSIS — E785 Hyperlipidemia, unspecified: Secondary | ICD-10-CM

## 2014-02-13 DIAGNOSIS — I2510 Atherosclerotic heart disease of native coronary artery without angina pectoris: Secondary | ICD-10-CM

## 2014-02-13 MED ORDER — METOPROLOL TARTRATE 25 MG PO TABS: 12.5000 mg | ORAL_TABLET | Freq: Two times a day (BID) | ORAL | Status: DC

## 2014-02-13 MED ORDER — PRASUGREL HCL 10 MG PO TABS: 10.0000 mg | ORAL_TABLET | Freq: Every day | ORAL | Status: DC

## 2014-02-13 MED ORDER — ROSUVASTATIN CALCIUM 5 MG PO TABS
5.0000 mg | ORAL_TABLET | Freq: Every day | ORAL | Status: DC
Start: 2014-02-13 — End: 2014-07-28

## 2014-02-13 NOTE — Patient Instructions (Signed)
Your physician wants you to follow-up in: 1 year You will receive a reminder letter in the mail two months in advance. If you don't receive a letter, please call our office to schedule the follow-up appointment.   Your physician has recommended you make the following change in your medication:   STOP Mevacor  STOP Effient  DECREASE Lopressor to 12.5 mg twice a day   START Crestor 5 mg daily     Thank you for choosing [facility] !

## 2014-02-13 NOTE — Progress Notes (Signed)
Clinical Summary Ms. Pepitone is a 53 y.o.female last seen by NP Purcell Nails, this is our first visit together. She is seen for the following medical problems.  1. CAD - prior DES to LAD 09/2012, lVEF 55% by LVgram at that time. -  09/2012 echo LVEF 55-60% - denies any chest pain, denis and SOB or DOE - compliant with meds,   2. Hyperlipidemia - nausea on atorva, previously changed to simva  3. HTN - does not check at home - compliant with meds  4. DM2 - followed by pcp Dr Quintin Alto  5. Hyperlipidemia - lipitor caused nausea, changed back to simva and tolerating well.   Past Medical History  Diagnosis Date  . Hypertension   . Diabetes mellitus type 2 with complications, uncontrolled   . Hyperlipidemia   . Endometriosis     TAH/BSO in 2003  . Breast carcinoma   . Basal cell carcinoma 2013    Resected from left neck  . Obesity      Allergies  Allergen Reactions  . Codeine Nausea And Vomiting    "deathly sick"  . Demerol [Meperidine]     Pt states all pain meds make her sick on her stomach  . Dilaudid [Hydromorphone Hcl] Nausea And Vomiting  . Morphine And Related Nausea And Vomiting     Current Outpatient Prescriptions  Medication Sig Dispense Refill  . aspirin 81 MG chewable tablet Chew 1 tablet (81 mg total) by mouth daily.      [name] atorvastatin (LIPITOR) 80 MG tablet Take 80 mg by mouth daily.       . citalopram (CELEXA) 40 MG tablet Take 40 mg by mouth daily.      [name] EFFIENT 10 MG TABS tablet TAKE 1 TABLET BY MOUTH EVERY DAY  15 tablet  0  . levothyroxine (SYNTHROID, LEVOTHROID) 125 MCG tablet Take 125 mcg by mouth daily before breakfast.      . lisinopril (PRINIVIL,ZESTRIL) 10 MG tablet Take 10 mg by mouth daily.      . metFORMIN (GLUCOPHAGE-XR) 500 MG 24 hr tablet Take 1,500 mg by mouth daily with breakfast.      . metoprolol tartrate (LOPRESSOR) 25 MG tablet Take 25 mg by mouth daily.      . nitroGLYCERIN (NITROSTAT) 0.4 MG SL tablet Place 1 tablet (0.4 mg  total) under the tongue every 5 (five) minutes as needed for chest pain.  30 tablet  12   No current facility-administered medications for this visit.     Past Surgical History  Procedure Laterality Date  . Mastectomy    . Total abdominal hysterectomy w/ bilateral salpingoophorectomy  2003  . Skin cancer excision  2013    Basal cell-left neck     Allergies  Allergen Reactions  . Codeine Nausea And Vomiting    "deathly sick"  . Demerol [Meperidine]     Pt states all pain meds make her sick on her stomach  . Dilaudid [Hydromorphone Hcl] Nausea And Vomiting  . Morphine And Related Nausea And Vomiting      No family history on file.   Social History Ms. Rotunno reports that she quit smoking about 24 years ago. Her smoking use included Cigarettes. She has a 15 pack-year smoking history. She does not have any smokeless tobacco history on file. Ms. Ryans reports that she drinks alcohol.   Review of Systems CONSTITUTIONAL: No weight loss, fever, chills, weakness or fatigue.  HEENT: Eyes: No visual loss, blurred vision, double vision  or yellow sclerae.No hearing loss, sneezing, congestion, runny nose or sore throat.  SKIN: No rash or itching.  CARDIOVASCULAR: per HPI RESPIRATORY: No shortness of breath, cough or sputum.  GASTROINTESTINAL: No anorexia, nausea, vomiting or diarrhea. No abdominal pain or blood.  GENITOURINARY: No burning on urination, no polyuria NEUROLOGICAL: No headache, dizziness, syncope, paralysis, ataxia, numbness or tingling in the extremities. No change in bowel or bladder control.  MUSCULOSKELETAL: No muscle, back pain, joint pain or stiffness.  LYMPHATICS: No enlarged nodes. No history of splenectomy.  PSYCHIATRIC: No history of depression or anxiety.  ENDOCRINOLOGIC: No reports of sweating, cold or heat intolerance. No polyuria or polydipsia.  [name]   Physical Examination p 77 bp 110/64 Wt 210 lbs BMI 36 Gen: resting comfortably, no acute  distress HEENT: no scleral icterus, pupils equal round and reactive, no palptable cervical adenopathy,  CV: RRR, no m/r/g, no JVD, no carotid bruits Resp: Clear to auscultation bilaterally GI: abdomen is soft, non-tender, non-distended, normal bowel sounds, no hepatosplenomegaly MSK: extremities are warm, no edema.  Skin: warm, no rash Neuro:  no focal deficits Psych: appropriate affect   Diagnostic Studies 09/2012 Cath PROCEDURAL FINDINGS  Hemodynamics:  AO 129/82 mean 104 mm Hg  LV 132/22 mm Hg  Coronary angiography:  Coronary dominance: right  Left mainstem: Normal  Left anterior descending (LAD): 90% mid LAD with moderate calcification. The distal LAD has diffuse irregularities.  Left circumflex (LCx): less than 10% luminal irregularity  Right coronary artery (RCA): Mild proximal irregularity less than 20%.  Left ventriculography: Left ventricular systolic function is normal, LVEF is estimated at 55%, there is mild mid to distal anterior wall hypokinesis, there is no significant mitral regurgitation  PCI Note: Following the diagnostic procedure, the decision was made to proceed with PCI. Weight-based bivalirudin was given for anticoagulation. Effient 60 mg was given orally. Once a therapeutic ACT was achieved, a 6 [place] XBLAD 3.5 guide catheter was inserted. A prowater coronary guidewire was used to cross the lesion. The lesion was predilated with a 2.5 mm compliant balloon. The lesion was then stented with a 3.5 x 28 mm Promus premier stent. The stent was postdilated with a 3.75 mm noncompliant balloon. Following PCI, there was 0% residual stenosis and TIMI-3 flow. Final angiography confirmed an excellent result. The patient tolerated the procedure well. There were no immediate procedural complications. A TR band was used for radial hemostasis. The patient was transferred to the post catheterization recovery area for further monitoring.  PCI Data:  Vessel - LAD/Segment - mid  Percent  Stenosis (pre) 90%  TIMI-flow 3  Stent 3.5 x 28 mm Promus premier  Percent Stenosis (post) 0%  TIMI-flow (post) 3   Final Conclusions:  1. Single vessel obstructive CAD with high grade mid LAD stenosis.  2. Well preserved LV systolic function with anterior hypokinesis.  3. Successful stenting of the mid LAD with DES.  Recommendations:  Continue dual antiplatelet therapy for one year. Risk factor modification.   09/2012 Echo Study Conclusions  - Left ventricle: Wall thickness was increased in a pattern of mild LVH with disproportionate septal thickening. Systolic function was normal. The estimated ejection fraction was in the range of 55% to 60%. Wall motion was normal; there were no regional wall motion abnormalities. - Aortic valve: Mildly calcified annulus. Trileaflet; mildly thickened leaflets. - Left atrium: The atrium was mildly dilated. - Atrial septum: No defect or patent foramen ovale was identified. - Pulmonary arteries: PA peak pressure: 22mm Hg (S).  02/13/14 Clinic EKG NSR  Assessment and Plan  1. CAD - no current symptoms - she has completed over a year of DAPT, will stop effient - continue risk factor modification and secondary prevention  2. HTN - at goal, continue current meds  3. DM type II - per Dr Quintin Alto  4. Hyperlipidemia - did not tolerate high dose lipitor. Based on her CAD preferred management is with high dose statin, will try crestor. Start at 5 mg initially, if tolerates increase to 20mg  daily per current lipid guidelines.       Arnoldo Lenis, M.D.

## 2014-04-24 ENCOUNTER — Encounter (HOSPITAL_COMMUNITY): Payer: Self-pay | Admitting: Cardiology

## 2014-07-28 ENCOUNTER — Telehealth: Payer: Self-pay | Admitting: Cardiology

## 2014-07-28 ENCOUNTER — Other Ambulatory Visit: Payer: Self-pay | Admitting: Cardiology

## 2014-07-28 MED ORDER — ROSUVASTATIN CALCIUM 5 MG PO TABS: 5.0000 mg | ORAL_TABLET | Freq: Every day | ORAL | Status: DC

## 2014-07-28 NOTE — Telephone Encounter (Signed)
Please see refill bin / tg  °

## 2014-07-28 NOTE — Telephone Encounter (Signed)
[facility]

## 2014-07-29 NOTE — Telephone Encounter (Signed)
Pt currently on Crestor per our records No answer, no machine

## 2014-07-29 NOTE — Telephone Encounter (Signed)
[employer] will not cover Crestor 5mg ,cost is 557.80 Atorvastatin 20 mg is 24.53, 40 mg 40 mg is 17.14, and 80 mg is $38.08   Please advise

## 2014-07-29 NOTE — Telephone Encounter (Signed)
We planned to change her to crestor last October, what has she been taking recently. Atorva would be her best option but at some time in the past she had nausea on it. Could consider retrying atorvastatin 20mg  daily and see how she tolerates if she is willing.   Zandra Abts MD

## 2014-07-31 MED ORDER — ATORVASTATIN CALCIUM 20 MG PO TABS: 20.0000 mg | ORAL_TABLET | Freq: Every day | ORAL | Status: DC

## 2014-07-31 NOTE — Telephone Encounter (Signed)
Contacted patient, she will try atorvastatin 20 mg daily again. Escribed to [employer]

## 2014-07-31 NOTE — Addendum Note (Signed)
Addended by: Barbarann Ehlers A on: 07/31/2014 08:36 AM   Modules accepted: Orders, Medications

## 2014-08-01 ENCOUNTER — Other Ambulatory Visit: Payer: Self-pay | Admitting: *Deleted

## 2014-08-01 NOTE — Telephone Encounter (Signed)
Medication already filled in leiu  of crestor as insurance won't pay.no form needs to be signed

## 2014-08-01 NOTE — Telephone Encounter (Signed)
Received fax refill request  Rx # [id number] Medication:  Atorvastatin 20 mg Qty 90 Sig:  Form that needs signed in bin Physician:  Harl Bowie

## 2014-08-08 ENCOUNTER — Telehealth: Payer: Self-pay | Admitting: Adult Health

## 2014-08-08 ENCOUNTER — Telehealth: Payer: Self-pay | Admitting: *Deleted

## 2014-08-08 NOTE — Telephone Encounter (Signed)
Patient states that she received 90 day supply of Crestor and the [place] only charged her $19. States she will call company and make sure this is correct before requesting to switched back to Crestor.

## 2014-08-08 NOTE — Telephone Encounter (Signed)
Please return patient call regarding Crestor  tg

## 2014-08-08 NOTE — Telephone Encounter (Signed)
Patient confirmed that copay is $60 for a 90 day supply. Will change back to Crestor and D/C Atorvastatin.

## 2014-08-08 NOTE — Telephone Encounter (Signed)
Error

## 2015-07-11 ENCOUNTER — Other Ambulatory Visit: Payer: Self-pay | Admitting: Cardiology

## 2015-08-06 ENCOUNTER — Other Ambulatory Visit: Payer: Self-pay | Admitting: Cardiology

## 2015-08-06 MED ORDER — ROSUVASTATIN CALCIUM 5 MG PO TABS: 5.0000 mg | ORAL_TABLET | Freq: Every day | ORAL | Status: DC

## 2015-08-06 NOTE — Telephone Encounter (Signed)
Refill crestor sent,pt states pharmacy did pay for crestor and atorvastatin made her sick

## 2015-08-06 NOTE — Telephone Encounter (Signed)
1. Which medications need to be refilled? (please list name of each medication and dose if known) Crestor 5mg   2. Which pharmacy/location (including street and city if local pharmacy) is medication to be sent to? [facility]   3. Do they need a 30 day or 90 day supply? 90 day

## 2015-09-10 ENCOUNTER — Emergency Department (HOSPITAL_COMMUNITY)
Admission: EM | Admit: 2015-09-10 | Discharge: 2015-09-10 | Disposition: A | Discharge status: Home / Self Care | Payer: BLUE CROSS/BLUE SHIELD | Attending: Emergency Medicine | Admitting: Emergency Medicine

## 2015-09-10 ENCOUNTER — Encounter (HOSPITAL_COMMUNITY): Payer: Self-pay | Admitting: Cardiology

## 2015-09-10 ENCOUNTER — Emergency Department (HOSPITAL_COMMUNITY): Payer: BLUE CROSS/BLUE SHIELD

## 2015-09-10 DIAGNOSIS — E669 Obesity, unspecified: Secondary | ICD-10-CM | POA: Diagnosis not present

## 2015-09-10 DIAGNOSIS — E119 Type 2 diabetes mellitus without complications: Secondary | ICD-10-CM | POA: Insufficient documentation

## 2015-09-10 DIAGNOSIS — E785 Hyperlipidemia, unspecified: Secondary | ICD-10-CM | POA: Diagnosis not present

## 2015-09-10 DIAGNOSIS — Z79899 Other long term (current) drug therapy: Secondary | ICD-10-CM | POA: Diagnosis not present

## 2015-09-10 DIAGNOSIS — R110 Nausea: Secondary | ICD-10-CM | POA: Insufficient documentation

## 2015-09-10 DIAGNOSIS — Z87891 Personal history of nicotine dependence: Secondary | ICD-10-CM | POA: Diagnosis not present

## 2015-09-10 DIAGNOSIS — I10 Essential (primary) hypertension: Secondary | ICD-10-CM | POA: Diagnosis not present

## 2015-09-10 DIAGNOSIS — Z853 Personal history of malignant neoplasm of breast: Secondary | ICD-10-CM | POA: Insufficient documentation

## 2015-09-10 DIAGNOSIS — R1013 Epigastric pain: Secondary | ICD-10-CM | POA: Insufficient documentation

## 2015-09-10 DIAGNOSIS — Z85828 Personal history of other malignant neoplasm of skin: Secondary | ICD-10-CM | POA: Diagnosis not present

## 2015-09-10 DIAGNOSIS — R079 Chest pain, unspecified: Secondary | ICD-10-CM

## 2015-09-10 LAB — BASIC METABOLIC PANEL
Anion gap: 12 (ref 5–15)
BUN: 16 mg/dL (ref 6–20)
CO2: 21 mmol/L — ABNORMAL LOW (ref 22–32)
Calcium: 8.9 mg/dL (ref 8.9–10.3)
Chloride: 102 mmol/L (ref 101–111)
Creatinine, Ser: 0.53 mg/dL (ref 0.44–1.00)
GFR calc Af Amer: >60 mL/min (ref >60)
GFR calc non Af Amer: >60 mL/min (ref >60)
Glucose, Bld: 357 mg/dL — ABNORMAL HIGH (ref 65–99)
Potassium: 4.7 mmol/L (ref 3.5–5.1)
Sodium: 135 mmol/L (ref 135–145)

## 2015-09-10 LAB — CBC
HCT: 42.8 % (ref 36.0–46.0)
Hemoglobin: 14.4 g/dL (ref 12.0–15.0)
MCH: 29.5 pg (ref 26.0–34.0)
MCHC: 33.6 g/dL (ref 30.0–36.0)
MCV: 87.7 fL (ref 78.0–100.0)
Platelets: 228 10*3/uL (ref 150–400)
RBC: 4.88 MIL/uL (ref 3.87–5.11)
RDW: 12.5 % (ref 11.5–15.5)
WBC: 11.4 10*3/uL — ABNORMAL HIGH (ref 4.0–10.5)

## 2015-09-10 LAB — TROPONIN I: Troponin I: <0.03 ng/mL (ref <0.031)

## 2015-09-10 MED ORDER — ONDANSETRON HCL 4 MG/2ML IJ SOLN
4.0000 mg | Freq: Once | INTRAMUSCULAR | Status: AC
Start: 2015-09-10 — End: 2015-09-10
  Administered 2015-09-10: 4 mg via INTRAVENOUS
  Filled 2015-09-10: qty 2

## 2015-09-10 MED ORDER — OMEPRAZOLE 20 MG PO CPDR: 20.0000 mg | DELAYED_RELEASE_CAPSULE | Freq: Two times a day (BID) | ORAL | Status: DC

## 2015-09-10 MED ORDER — PANTOPRAZOLE SODIUM 40 MG IV SOLR
40.0000 mg | Freq: Once | INTRAVENOUS | Status: AC
Start: 2015-09-10 — End: 2015-09-10
  Administered 2015-09-10: 40 mg via INTRAVENOUS
  Filled 2015-09-10: qty 40

## 2015-09-10 MED ORDER — INSULIN ASPART 100 UNIT/ML ~~LOC~~ SOLN
10.0000 [IU] | Freq: Once | SUBCUTANEOUS | Status: AC
  Administered 2015-09-10: 10 [IU] via INTRAVENOUS
  Filled 2015-09-10: qty 1

## 2015-09-10 MED ORDER — GI COCKTAIL ~~LOC~~
30.0000 mL | Freq: Once | ORAL | Status: AC
  Administered 2015-09-10: 30 mL via ORAL
  Filled 2015-09-10: qty 30

## 2015-09-10 MED ORDER — SODIUM CHLORIDE 0.9 % IV BOLUS (SEPSIS)
500.0000 mL | Freq: Once | INTRAVENOUS | Status: AC
  Administered 2015-09-10: 500 mL via INTRAVENOUS

## 2015-09-10 NOTE — ED Notes (Signed)
Chest pain started at 2 am.  Vomited times one.  C/o increase gas.

## 2015-09-12 DIAGNOSIS — F411 Generalized anxiety disorder: Secondary | ICD-10-CM | POA: Diagnosis not present

## 2015-09-12 DIAGNOSIS — I10 Essential (primary) hypertension: Secondary | ICD-10-CM | POA: Diagnosis not present

## 2015-09-12 DIAGNOSIS — I2510 Atherosclerotic heart disease of native coronary artery without angina pectoris: Secondary | ICD-10-CM | POA: Diagnosis not present

## 2015-09-12 DIAGNOSIS — E782 Mixed hyperlipidemia: Secondary | ICD-10-CM | POA: Diagnosis not present

## 2015-09-12 DIAGNOSIS — K210 Gastro-esophageal reflux disease with esophagitis: Secondary | ICD-10-CM | POA: Diagnosis not present

## 2015-09-12 DIAGNOSIS — R0789 Other chest pain: Secondary | ICD-10-CM | POA: Diagnosis not present

## 2015-09-12 DIAGNOSIS — E1165 Type 2 diabetes mellitus with hyperglycemia: Secondary | ICD-10-CM | POA: Diagnosis not present

## 2015-09-14 ENCOUNTER — Ambulatory Visit (INDEPENDENT_AMBULATORY_CARE_PROVIDER_SITE_OTHER): Payer: BLUE CROSS/BLUE SHIELD | Admitting: Adult Health

## 2015-09-14 ENCOUNTER — Encounter: Payer: Self-pay | Admitting: *Deleted

## 2015-09-14 ENCOUNTER — Encounter: Payer: Self-pay | Admitting: Adult Health

## 2015-09-14 VITALS — BP 124/64 | HR 71 | Ht 64.0 in | Wt 201.0 lb

## 2015-09-14 DIAGNOSIS — I2510 Atherosclerotic heart disease of native coronary artery without angina pectoris: Secondary | ICD-10-CM | POA: Diagnosis not present

## 2015-09-14 DIAGNOSIS — R079 Chest pain, unspecified: Secondary | ICD-10-CM

## 2015-09-14 MED ORDER — NITROGLYCERIN 0.4 MG SL SUBL: 0.4000 mg | SUBLINGUAL_TABLET | SUBLINGUAL | Status: DC | PRN

## 2015-09-14 NOTE — Progress Notes (Signed)
Cardiology Office Note   Date:  09/14/2015   ID:  [name], DOB [birth year], MRN [id number]  PCP:  Manon Hilding, MD  Cardiologist: Cloria Spring, NP   Chief Complaint  Patient presents with  . Coronary Artery Disease      History of Present Illness: [name] is a 55 y.o. female who presents for ongoing assessment and management of coronary artery disease, known history of drug-eluting stent to the LAD in May of 2014, most recent echocardiogram revealing LVEF of 55-60%, hypertension, hyperlipidemia, with history of diabetes. The patient was last seen in the office by Dr. Harl Bowie the on 02/14/2014.no testing was completed and he was to continue medical management. Effient was stopped.  Unfortunately she was seen in the emergency room on 09/10/2015 with chest pain which started around 2 AM this associated vomiting and gas.[name] She was seen by her primary care physician who decreased her Protonix to once a day. Refer her to cardiology for further evaluation of recurrent chest pain.  She has not had any recurrent chest pain since that episode. She states that she had eaten a heavy meal to include [place] food the night before this all started. She is watching her diet now.  Past Medical History  Diagnosis Date  . Hypertension   . Diabetes mellitus type 2 with complications, uncontrolled ([place])   . Hyperlipidemia   . Endometriosis     TAH/BSO in 2003  . Breast carcinoma ([name])   . Basal cell carcinoma 2013    Resected from left neck  . Obesity     Past Surgical History  Procedure Laterality Date  . Mastectomy    . Total abdominal hysterectomy w/ bilateral salpingoophorectomy  2003  . Skin cancer excision  2013    Basal cell-left neck  . Left heart catheterization with coronary angiogram N/A 10/05/2012    Procedure: LEFT HEART CATHETERIZATION WITH CORONARY ANGIOGRAM;  Surgeon: Peter M Martinique, MD;  Location: [facility] CATH LAB;  Service: Cardiovascular;  Laterality: N/A;   . Percutaneous coronary stent intervention (pci-s)  10/05/2012    Procedure: PERCUTANEOUS CORONARY STENT INTERVENTION (PCI-S);  Surgeon: Peter M Martinique, MD;  Location: [facility] CATH LAB;  Service: Cardiovascular;;     Current Outpatient Prescriptions  Medication Sig Dispense Refill  . aspirin EC 81 MG tablet Take 81 mg by mouth daily.    . metFORMIN (GLUCOPHAGE) 500 MG tablet Take 500 mg by mouth 2 (two) times daily with a meal. Takes 1250 mg daily    . omeprazole (PRILOSEC) 20 MG capsule Take 20 mg by mouth daily.    . citalopram (CELEXA) 40 MG tablet Take 40 mg by mouth daily.    [name] levothyroxine (SYNTHROID, LEVOTHROID) 125 MCG tablet Take 125 mcg by mouth daily before breakfast.    . lisinopril (PRINIVIL,ZESTRIL) 10 MG tablet Take 10 mg by mouth daily.    . metoprolol tartrate (LOPRESSOR) 25 MG tablet Take 0.5 tablets (12.5 mg total) by mouth 2 (two) times daily. 90 tablet 3  . nitroGLYCERIN (NITROSTAT) 0.4 MG SL tablet Place 1 tablet (0.4 mg total) under the tongue every 5 (five) minutes as needed for chest pain. 75 tablet 0  . rosuvastatin (CRESTOR) 5 MG tablet Take 1 tablet (5 mg total) by mouth daily. 90 tablet 3   No current facility-administered medications for this visit.    Allergies:   Codeine; Demerol; Dilaudid; Morphine and related; and Atorvastatin    Social History:  The patient  reports that  she quit smoking about 26 years ago. Her smoking use included Cigarettes. She started smoking about 40 years ago. She has a 15 pack-year smoking history. She has never used smokeless tobacco. She reports that she drinks alcohol. She reports that she does not use illicit drugs.   Family History:  The patient's family history is not on file.    ROS: All other systems are reviewed and negative. Unless otherwise mentioned in H&P    PHYSICAL EXAM: VS:  BP 124/64 mmHg  Pulse 71  Ht 5\' 4"  (1.626 m)  Wt 201 lb (91.173 kg)  BMI 34.48 kg/m2  SpO2 96% , BMI Body mass index is 34.48  kg/(m^2). GEN: Well nourished, well developed, in no acute distress HEENT: normal Neck: no JVD, carotid bruits, or masses Cardiac: RRR; no murmurs, rubs, or gallops,no edema  Respiratory:  clear to auscultation bilaterally, normal work of breathing GI: soft, nontender, nondistended, + BS MS: no deformity or atrophy Skin: warm and dry, no rash Neuro:  Strength and sensation are intact Psych: euthymic mood, full affect   The ekg ordered today demonstrates normal sinus rhythm with nonspecific T wave abnormalities apically.   Recent Labs: 09/10/2015: BUN 16; Creatinine, Ser 0.53; Hemoglobin 14.4; Platelets 228; Potassium 4.7; Sodium 135    Lipid Panel    Component Value Date/Time   CHOL 272* 10/03/2012 1018   TRIG 179* 10/03/2012 1018   HDL 50 10/03/2012 1018   CHOLHDL 5.4 10/03/2012 1018   VLDL 36 10/03/2012 1018   LDLCALC 186* 10/03/2012 1018      Wt Readings from Last 3 Encounters:  09/14/15 201 lb (91.173 kg)  09/10/15 195 lb (88.451 kg)  02/13/14 210 lb (95.255 kg)      Other studies Reviewed: Additional studies/ records that were reviewed today include: cardiac cath Review of the above records demonstrates: as discussed above   ASSESSMENT AND PLAN:  1.  Chest pain: : Atypical features associated with nausea vomiting and heartburn. Was seen in the emergency room troponin was negative, she was given a GI cocktail which relieved symptoms. She continues on PPI. She's had no recurrent chest pain. However, due to recurrent symptoms and known history of CAD with risk factors of hypertension diabetes thyroid disease and hypercholesterolemia, I will plan a exercise Cardiolite. The patient will hold the Toprol the morning of her test and wear comfortable walking shoes. The procedure has been explained to the patient he verbalizes understanding.I've given her a prescription for nitroglycerin sublingual.   2. Coronary artery disease: The patient has a history of a drug-eluting  stent to the proximal LAD in 2014. She continues on aspirin ELIQUIS was discontinued on last office visit.she will continue on enteric aspirin. Stress test as discussed.  3. Diabetes: patient states her blood glucose has been elevated lately. I've advised her to followup with her primary care physician for medication adjustments as necessary.   Current medicines are reviewed at length with the patient today.    Labs/ tests ordered today include:exercise Cardiolite  Orders Placed This Encounter  Procedures  . NM Myocar Multi W/Spect W/Wall Motion / EF     Disposition:   FU with post stress test.   Signed, Jory Sims, NP  09/14/2015 3:52 PM    [facility] [address] [place], [place] [address] Phone: [phone]; Fax: [phone]

## 2015-09-14 NOTE — Progress Notes (Deleted)
Name: [name]    DOB: Jul 29, 1960  Age: 55 y.o.  MR#: [id number]       PCP:  Manon Hilding, MD      Insurance: Payor: [place] / Plan: [place] / Product Type: *No Product type* /   CC:   No chief complaint on file.   VS Filed Vitals:   09/14/15 1416  BP: 124/64  Pulse: 71  Height: 5\' 4"  (1.626 m)  Weight: 201 lb (91.173 kg)  SpO2: 96%    Weights Current Weight  09/14/15 201 lb (91.173 kg)  09/10/15 195 lb (88.451 kg)  02/13/14 210 lb (95.255 kg)    Blood Pressure  BP Readings from Last 3 Encounters:  09/14/15 124/64  09/10/15 143/85  02/13/14 110/64     Admit date:  (Not on file) Last encounter with RMR:  Visit date not found   Allergy Codeine; Demerol; Dilaudid; Morphine and related; and Atorvastatin  Current Outpatient Prescriptions  Medication Sig Dispense Refill  . aspirin EC 81 MG tablet Take 81 mg by mouth daily.    . metFORMIN (GLUCOPHAGE) 500 MG tablet Take 500 mg by mouth 2 (two) times daily with a meal. Takes 1250 mg daily    . omeprazole (PRILOSEC) 20 MG capsule Take 20 mg by mouth daily.    . citalopram (CELEXA) 40 MG tablet Take 40 mg by mouth daily.    [name] levothyroxine (SYNTHROID, LEVOTHROID) 125 MCG tablet Take 125 mcg by mouth daily before breakfast.    . lisinopril (PRINIVIL,ZESTRIL) 10 MG tablet Take 10 mg by mouth daily.    . metoprolol tartrate (LOPRESSOR) 25 MG tablet Take 0.5 tablets (12.5 mg total) by mouth 2 (two) times daily. 90 tablet 3  . nitroGLYCERIN (NITROSTAT) 0.4 MG SL tablet Place 1 tablet (0.4 mg total) under the tongue every 5 (five) minutes as needed for chest pain. 30 tablet 12  . rosuvastatin (CRESTOR) 5 MG tablet Take 1 tablet (5 mg total) by mouth daily. 90 tablet 3   No current facility-administered medications for this visit.    Discontinued Meds:    Medications Discontinued During This Encounter  Medication Reason  . aspirin 81 MG chewable tablet Error  . metFORMIN (GLUCOPHAGE-XR) 500 MG 24 hr  tablet Error  . omeprazole (PRILOSEC) 20 MG capsule Error    Patient Active Problem List   Diagnosis Date Noted  . Hyperlipidemia 10/04/2012  . Breast carcinoma ([place]) 10/04/2012  . Surgical menopause 10/04/2012  . Non-STEMI (non-ST elevated myocardial infarction) ([place]) 10/03/2012  . Hypertension 10/03/2012  . Diabetes mellitus type 2, uncontrolled, with complications ([place]) 99991111  . Hypothyroidism 10/03/2012  . Morbid obesity ([place]) 10/03/2012    LABS    Component Value Date/Time   NA 135 09/10/2015 1306   NA 139 10/06/2012 0539   NA 138 10/04/2012 0450   K 4.7 09/10/2015 1306   K 3.8 10/06/2012 0539   K 3.8 10/04/2012 0450   CL 102 09/10/2015 1306   CL 104 10/06/2012 0539   CL 101 10/04/2012 0450   CO2 21* 09/10/2015 1306   CO2 23 10/06/2012 0539   CO2 27 10/04/2012 0450   GLUCOSE 357* 09/10/2015 1306   GLUCOSE 240* 10/06/2012 0539   GLUCOSE 306* 10/04/2012 0450   BUN 16 09/10/2015 1306   BUN 8 10/06/2012 0539   BUN 9 10/04/2012 0450   CREATININE 0.53 09/10/2015 1306   CREATININE 0.42* 10/06/2012 0539   CREATININE 0.45* 10/04/2012 0450   CALCIUM 8.9  09/10/2015 1306   CALCIUM 9.2 10/06/2012 0539   CALCIUM 8.8 10/04/2012 0450   GFRNONAA >60 09/10/2015 1306   GFRNONAA >90 10/06/2012 0539   GFRNONAA >90 10/04/2012 0450   GFRAA >60 09/10/2015 1306   GFRAA >90 10/06/2012 0539   GFRAA >90 10/04/2012 0450   CMP     Component Value Date/Time   NA 135 09/10/2015 1306   K 4.7 09/10/2015 1306   CL 102 09/10/2015 1306   CO2 21* 09/10/2015 1306   GLUCOSE 357* 09/10/2015 1306   BUN 16 09/10/2015 1306   CREATININE 0.53 09/10/2015 1306   CALCIUM 8.9 09/10/2015 1306   PROT 7.1 10/03/2012 1018   ALBUMIN 3.6 10/03/2012 1018   AST 32 10/03/2012 1018   ALT 31 10/03/2012 1018   ALKPHOS 119* 10/03/2012 1018   BILITOT 0.5 10/03/2012 1018   GFRNONAA >60 09/10/2015 1306   GFRAA >60 09/10/2015 1306       Component Value Date/Time   WBC 11.4* 09/10/2015 1306   WBC 8.3  10/06/2012 0539   WBC 8.3 10/05/2012 0502   HGB 14.4 09/10/2015 1306   HGB 12.0 10/06/2012 0539   HGB 12.1 10/05/2012 0502   HCT 42.8 09/10/2015 1306   HCT 35.8* 10/06/2012 0539   HCT 37.2 10/05/2012 0502   MCV 87.7 09/10/2015 1306   MCV 88.6 10/06/2012 0539   MCV 91.0 10/05/2012 0502    Lipid Panel     Component Value Date/Time   CHOL 272* 10/03/2012 1018   TRIG 179* 10/03/2012 1018   HDL 50 10/03/2012 1018   CHOLHDL 5.4 10/03/2012 1018   VLDL 36 10/03/2012 1018   LDLCALC 186* 10/03/2012 1018    ABG No results found for: PHART, PCO2ART, PO2ART, HCO3, TCO2, ACIDBASEDEF, O2SAT   No results found for: TSH BNP (last 3 results) No results for input(s): BNP in the last 8760 hours.  ProBNP (last 3 results) No results for input(s): PROBNP in the last 8760 hours.  Cardiac Panel (last 3 results) No results for input(s): CKTOTAL, CKMB, TROPONINI, RELINDX in the last 72 hours.  Iron/TIBC/Ferritin/ %Sat No results found for: IRON, TIBC, FERRITIN, IRONPCTSAT   EKG Orders placed or performed during the hospital encounter of 09/10/15  . ED EKG within 10 minutes  . ED EKG within 10 minutes  . EKG 12-Lead  . EKG 12-Lead  . EKG     Prior Assessment and Plan Problem List as of 09/14/2015      Cardiovascular and Mediastinum   Non-STEMI (non-ST elevated myocardial infarction) [facility])   Last Assessment & Plan 02/07/2013 Office Visit Written 02/07/2013  3:47 PM by Lendon Colonel, NP    She is recovering well. She has not been able to afford cardiac rehabilitation, but is walking with a friend of hers who is a Clinical research associate, every other day. She states the friend is doing this for free. She plans on losing weight and being more active. She denies any associated discomfort in her chest during exertion. She states she occasionally has some pressure in her chest wall at work which she is attributing now to caffeine and stress. She is going to try and balance her life a little more and she is  working 7 days a week, and she owns her own business. She is considering taking a day off or more during the week to help her feel less burdened. I have encouraged her to do this.      Hypertension   Last Assessment & Plan 02/07/2013 Office  Visit Written 02/07/2013  3:48 PM by Lendon Colonel, NP    Blood pressure is currently well-controlled. She denies any dizziness or near syncope. She will continue on current medication regimen without changes.        Endocrine   Diabetes mellitus type 2, uncontrolled, with complications ([place])   Hypothyroidism     Other   Morbid obesity ([place])   Hyperlipidemia   Last Assessment & Plan 02/07/2013 Office Visit Written 02/07/2013  3:48 PM by Lendon Colonel, NP    1 a followup labs completed in 3 months, or can be completed by primary care physician Dr. Quintin Alto. She will remain on Lipitor 80 mg daily. She has no complaints of myalgias.      Breast carcinoma [facility])   Surgical menopause       Imaging: Dg Chest 2 View  09/10/2015  CLINICAL DATA:  Pt states she has been having chest pain onset since 2 am. Pt also c/o of chest pain and N/V. Pt states she does have a stent in and states she has a hx of breast cancer. EXAM: CHEST  2 VIEW COMPARISON:  10/03/2012 FINDINGS: The heart size and mediastinal contours are within normal limits. Both lungs are clear. A no pleural effusion or pneumothorax Status post left breast surgery, stable. Bony thorax is demineralized with mild anterior wedging of the T12 vertebrae. This is stable. IMPRESSION: No active cardiopulmonary disease. Electronically Signed   By: Lajean Manes M.D.   On: 09/10/2015 13:13

## 2015-09-14 NOTE — Patient Instructions (Signed)
Your physician recommends that you schedule a follow-up appointment in: 1 Week with Jory Sims, NP  Your physician recommends that you continue on your current medications as directed. Please refer to the Current Medication list given to you today.  Your physician has requested that you have en exercise stress myoview. For further information please visit [email or web address]. Please follow instruction sheet, as given.  If you need a refill on your cardiac medications before your next appointment, please call your pharmacy.  Thank you for choosing [facility]!

## 2015-09-15 ENCOUNTER — Inpatient Hospital Stay (HOSPITAL_COMMUNITY): Admission: RE | Admit: 2015-09-15 | Payer: BLUE CROSS/BLUE SHIELD | Source: Ambulatory Visit

## 2015-09-15 ENCOUNTER — Encounter (HOSPITAL_COMMUNITY)
Admission: RE | Admit: 2015-09-15 | Discharge: 2015-09-15 | Disposition: A | Discharge status: Home / Self Care | Payer: BLUE CROSS/BLUE SHIELD | Source: Ambulatory Visit | Attending: Adult Health | Admitting: Adult Health

## 2015-09-15 ENCOUNTER — Encounter (HOSPITAL_COMMUNITY): Payer: Self-pay

## 2015-09-15 DIAGNOSIS — R079 Chest pain, unspecified: Secondary | ICD-10-CM

## 2015-09-15 LAB — NM MYOCAR MULTI W/SPECT W/WALL MOTION / EF
CHL CUP NUCLEAR SRS: 0
CHL CUP NUCLEAR SSS: 0
CSEPEDS: 20 s
Estimated workload: 7 METS
Exercise duration (min): 5 min
LHR: 0
LV dias vol: 64 mL (ref 46–106)
LVSYSVOL: 19 mL
MPHR: 164 {beats}/min
Peak HR: 157 {beats}/min
Percent HR: 95 %
RPE: 18
Rest HR: 71 {beats}/min
SDS: 0
TID: 1.13

## 2015-09-15 MED ORDER — TECHNETIUM TC 99M SESTAMIBI GENERIC - CARDIOLITE
30.0000 | Freq: Once | INTRAVENOUS | Status: AC | PRN
  Administered 2015-09-15: 29.7 via INTRAVENOUS

## 2015-09-15 MED ORDER — REGADENOSON 0.4 MG/5ML IV SOLN
INTRAVENOUS | Status: AC
  Filled 2015-09-15: qty 5

## 2015-09-15 MED ORDER — TECHNETIUM TC 99M SESTAMIBI - CARDIOLITE
10.0000 | Freq: Once | INTRAVENOUS | Status: AC | PRN
  Administered 2015-09-15: 10.4 via INTRAVENOUS

## 2015-09-15 MED ORDER — SODIUM CHLORIDE 0.9% FLUSH
INTRAVENOUS | Status: AC
  Administered 2015-09-15: 10 mL via INTRAVENOUS
  Filled 2015-09-15: qty 10

## 2015-09-21 ENCOUNTER — Ambulatory Visit (INDEPENDENT_AMBULATORY_CARE_PROVIDER_SITE_OTHER): Payer: BLUE CROSS/BLUE SHIELD | Admitting: Adult Health

## 2015-09-21 ENCOUNTER — Encounter: Payer: Self-pay | Admitting: Adult Health

## 2015-09-21 VITALS — BP 106/60 | HR 76 | Ht 64.0 in | Wt 201.0 lb

## 2015-09-21 DIAGNOSIS — I10 Essential (primary) hypertension: Secondary | ICD-10-CM | POA: Diagnosis not present

## 2015-09-21 DIAGNOSIS — R109 Unspecified abdominal pain: Secondary | ICD-10-CM

## 2015-09-21 DIAGNOSIS — I2510 Atherosclerotic heart disease of native coronary artery without angina pectoris: Secondary | ICD-10-CM

## 2015-09-21 DIAGNOSIS — R0789 Other chest pain: Secondary | ICD-10-CM | POA: Diagnosis not present

## 2015-09-21 NOTE — Progress Notes (Deleted)
Cardiology Office Note   Date:  09/21/2015   ID:  [name], DOB [birth year], MRN [id number]  PCP:  Manon Hilding, MD  Cardiologist:Branch/  Jory Sims, NP   Chief Complaint  Patient presents with  . Coronary Artery Disease      History of Present Illness: [name] is a 55 y.o. female who presents for ongoing assessment and management of coronary artery disease, known history of drug-eluting stent to the LAD in May of 2014, most recent echocardiogram revealing LVEF of 55-60%, hypertension, hyperlipidemia, with history of diabetes. She was plan for an exercise Cardiolite when last seen in 09/14/2015.    No diagnostic ST segment changes to indicate ischemia. No arrhythmias. Low risk Duke treadmill score of 5.5.  No significant myocardial perfusion defects to indicate scar or ischemia.  This is a low risk study.  Nuclear stress EF: 70%.  She is without any further complaints. She has been watching her diet and avoiding spicy foods. She does have a followup appointment with Dr. Quintin Alto for further evaluation of her chest discomfort which may be related to gallbladder disease. Deferring this to primary.  Past Medical History  Diagnosis Date  . Hypertension   . Diabetes mellitus type 2 with complications, uncontrolled ([place])   . Hyperlipidemia   . Endometriosis     TAH/BSO in 2003  . Breast carcinoma ([place])   . Basal cell carcinoma 2013    Resected from left neck  . Obesity     Past Surgical History  Procedure Laterality Date  . Mastectomy    . Total abdominal hysterectomy w/ bilateral salpingoophorectomy  2003  . Skin cancer excision  2013    Basal cell-left neck  . Left heart catheterization with coronary angiogram N/A 10/05/2012    Procedure: LEFT HEART CATHETERIZATION WITH CORONARY ANGIOGRAM;  Surgeon: Peter M Martinique, MD;  Location: [facility] CATH LAB;  Service: Cardiovascular;  Laterality: N/A;  . Percutaneous coronary stent intervention (pci-s)  10/05/2012   Procedure: PERCUTANEOUS CORONARY STENT INTERVENTION (PCI-S);  Surgeon: Peter M Martinique, MD;  Location: [facility] C[facility] LAB;  Service: Cardiovascular;;     Current Outpatient Prescriptions  Medication Sig Dispense Refill  . aspirin EC 81 MG tablet Take 81 mg by mouth daily.    . citalopram (CELEXA) 40 MG tablet Take 40 mg by mouth daily.    [name] levothyroxine (SYNTHROID, LEVOTHROID) 125 MCG tablet Take 125 mcg by mouth daily before breakfast.    . lisinopril (PRINIVIL,ZESTRIL) 10 MG tablet Take 10 mg by mouth daily.    . metFORMIN (GLUCOPHAGE) 500 MG tablet Take 500 mg by mouth 2 (two) times daily with a meal. Takes 1250 mg daily    . metoprolol tartrate (LOPRESSOR) 25 MG tablet Take 0.5 tablets (12.5 mg total) by mouth 2 (two) times daily. 90 tablet 3  . nitroGLYCERIN (NITROSTAT) 0.4 MG SL tablet Place 1 tablet (0.4 mg total) under the tongue every 5 (five) minutes as needed for chest pain. 75 tablet 0  . omeprazole (PRILOSEC) 20 MG capsule Take 20 mg by mouth daily.    . rosuvastatin (CRESTOR) 5 MG tablet Take 1 tablet (5 mg total) by mouth daily. 90 tablet 3   No current facility-administered medications for this visit.    Allergies:   Codeine; Demerol; Dilaudid; Morphine and related; and Atorvastatin    Social History:  The patient  reports that she quit smoking about 26 years ago. Her smoking use included Cigarettes. She started smoking about 40 years ago.  She has a 15 pack-year smoking history. She has never used smokeless tobacco. She reports that she drinks alcohol. She reports that she does not use illicit drugs.   Family History:  The patient's family history is not on file.    ROS: All other systems are reviewed and negative. Unless otherwise mentioned in H&P    PHYSICAL EXAM: VS:  BP 106/60 mmHg  Pulse 76  Ht 5\' 4"  (1.626 m)  Wt 201 lb (91.173 kg)  BMI 34.48 kg/m2  SpO2 96% , BMI Body mass index is 34.48 kg/(m^2). GEN: Well nourished, well developed, in no acute distress Cardiac:  RRR; no murmurs, rubs, or gallops,no edema  Respiratory:  clear to auscultation bilaterally, normal work of breathing Neuro:  Strength and sensation are intact Psych: euthymic mood, full affect  Recent Labs: 09/10/2015: BUN 16; Creatinine, Ser 0.53; Hemoglobin 14.4; Platelets 228; Potassium 4.7; Sodium 135    Lipid Panel    Component Value Date/Time   CHOL 272* 10/03/2012 1018   TRIG 179* 10/03/2012 1018   HDL 50 10/03/2012 1018   CHOLHDL 5.4 10/03/2012 1018   VLDL 36 10/03/2012 1018   LDLCALC 186* 10/03/2012 1018      Wt Readings from Last 3 Encounters:  09/21/15 201 lb (91.173 kg)  09/14/15 201 lb (91.173 kg)  09/10/15 195 lb (88.451 kg)      ASSESSMENT AND PLAN:  1.  Chest pain: Exercise Cardiolite was negative for ischemia. A copy of this test is given to her along with reassurance. She is to follow up with her primary care for further evaluation of noncardiac chest pain to include GI etiology. We'll go ahead and order a gallbladder ultrasound so that she will have this available for her primary care on next appointment.  2. CAD: history drug-eluting stent to the LAD in May 2014. She is to continue aspirin, beta blocker, and statin.  3. Hypertension: blood pressure is well controlled currently. Little soft today. May need to decrease lisinopril.if this remains low. No changes at this time.  Current medicines are reviewed at length with the patient today.    Labs/ tests ordered today include: No orders of the defined types were placed in this encounter.     Disposition:   FU with 6 months Signed, Jory Sims, NP  09/21/2015 2:31 PM    [facility] [address], [place], [place] [address] Phone: [phone]; Fax: [phone]

## 2015-09-21 NOTE — Progress Notes (Signed)
Name: [name]    DOB: 07/23/1960  Age: 55 y.o.  MR#: [id number]       PCP:  Manon Hilding, MD      Insurance: Payor: [place] / Plan: [place] / Product Type: *No Product type* /   CC:   No chief complaint on file.   VS Filed Vitals:   09/21/15 1419  BP: 106/60  Pulse: 76  Height: 5\' 4"  (1.626 m)  Weight: 201 lb (91.173 kg)  SpO2: 96%    Weights Current Weight  09/21/15 201 lb (91.173 kg)  09/14/15 201 lb (91.173 kg)  09/10/15 195 lb (88.451 kg)    Blood Pressure  BP Readings from Last 3 Encounters:  09/21/15 106/60  09/14/15 124/64  09/10/15 143/85     Admit date:  (Not on file) Last encounter with RMR:  09/14/2015   Allergy Codeine; Demerol; Dilaudid; Morphine and related; and Atorvastatin  Current Outpatient Prescriptions  Medication Sig Dispense Refill  . aspirin EC 81 MG tablet Take 81 mg by mouth daily.    . citalopram (CELEXA) 40 MG tablet Take 40 mg by mouth daily.    [name] levothyroxine (SYNTHROID, LEVOTHROID) 125 MCG tablet Take 125 mcg by mouth daily before breakfast.    . lisinopril (PRINIVIL,ZESTRIL) 10 MG tablet Take 10 mg by mouth daily.    . metFORMIN (GLUCOPHAGE) 500 MG tablet Take 500 mg by mouth 2 (two) times daily with a meal. Takes 1250 mg daily    . metoprolol tartrate (LOPRESSOR) 25 MG tablet Take 0.5 tablets (12.5 mg total) by mouth 2 (two) times daily. 90 tablet 3  . nitroGLYCERIN (NITROSTAT) 0.4 MG SL tablet Place 1 tablet (0.4 mg total) under the tongue every 5 (five) minutes as needed for chest pain. 75 tablet 0  . omeprazole (PRILOSEC) 20 MG capsule Take 20 mg by mouth daily.    . rosuvastatin (CRESTOR) 5 MG tablet Take 1 tablet (5 mg total) by mouth daily. 90 tablet 3   No current facility-administered medications for this visit.    Discontinued Meds:   There are no discontinued medications.  Patient Active Problem List   Diagnosis Date Noted  . Hyperlipidemia 10/04/2012  . Breast carcinoma ([place]) 10/04/2012  .  Surgical menopause 10/04/2012  . Non-STEMI (non-ST elevated myocardial infarction) ([place]) 10/03/2012  . Hypertension 10/03/2012  . Diabetes mellitus type 2, uncontrolled, with complications ([place]) 99991111  . Hypothyroidism 10/03/2012  . Morbid obesity ([place]) 10/03/2012    LABS    Component Value Date/Time   NA 135 09/10/2015 1306   NA 139 10/06/2012 0539   NA 138 10/04/2012 0450   K 4.7 09/10/2015 1306   K 3.8 10/06/2012 0539   K 3.8 10/04/2012 0450   CL 102 09/10/2015 1306   CL 104 10/06/2012 0539   CL 101 10/04/2012 0450   CO2 21* 09/10/2015 1306   CO2 23 10/06/2012 0539   CO2 27 10/04/2012 0450   GLUCOSE 357* 09/10/2015 1306   GLUCOSE 240* 10/06/2012 0539   GLUCOSE 306* 10/04/2012 0450   BUN 16 09/10/2015 1306   BUN 8 10/06/2012 0539   BUN 9 10/04/2012 0450   CREATININE 0.53 09/10/2015 1306   CREATININE 0.42* 10/06/2012 0539   CREATININE 0.45* 10/04/2012 0450   CALCIUM 8.9 09/10/2015 1306   CALCIUM 9.2 10/06/2012 0539   CALCIUM 8.8 10/04/2012 0450   GFRNONAA >60 09/10/2015 1306   GFRNONAA >90 10/06/2012 0539   GFRNONAA >90 10/04/2012 0450   GFRAA >  60 09/10/2015 1306   GFRAA >90 10/06/2012 0539   GFRAA >90 10/04/2012 0450   CMP     Component Value Date/Time   NA 135 09/10/2015 1306   K 4.7 09/10/2015 1306   CL 102 09/10/2015 1306   CO2 21* 09/10/2015 1306   GLUCOSE 357* 09/10/2015 1306   BUN 16 09/10/2015 1306   CREATININE 0.53 09/10/2015 1306   CALCIUM 8.9 09/10/2015 1306   PROT 7.1 10/03/2012 1018   ALBUMIN 3.6 10/03/2012 1018   AST 32 10/03/2012 1018   ALT 31 10/03/2012 1018   ALKPHOS 119* 10/03/2012 1018   BILITOT 0.5 10/03/2012 1018   GFRNONAA >60 09/10/2015 1306   GFRAA >60 09/10/2015 1306       Component Value Date/Time   WBC 11.4* 09/10/2015 1306   WBC 8.3 10/06/2012 0539   WBC 8.3 10/05/2012 0502   HGB 14.4 09/10/2015 1306   HGB 12.0 10/06/2012 0539   HGB 12.1 10/05/2012 0502   HCT 42.8 09/10/2015 1306   HCT 35.8* 10/06/2012 0539    HCT 37.2 10/05/2012 0502   MCV 87.7 09/10/2015 1306   MCV 88.6 10/06/2012 0539   MCV 91.0 10/05/2012 0502    Lipid Panel     Component Value Date/Time   CHOL 272* 10/03/2012 1018   TRIG 179* 10/03/2012 1018   HDL 50 10/03/2012 1018   CHOLHDL 5.4 10/03/2012 1018   VLDL 36 10/03/2012 1018   LDLCALC 186* 10/03/2012 1018    ABG No results found for: PHART, PCO2ART, PO2ART, HCO3, TCO2, ACIDBASEDEF, O2SAT   No results found for: TSH BNP (last 3 results) No results for input(s): BNP in the last 8760 hours.  ProBNP (last 3 results) No results for input(s): PROBNP in the last 8760 hours.  Cardiac Panel (last 3 results) No results for input(s): CKTOTAL, CKMB, TROPONINI, RELINDX in the last 72 hours.  Iron/TIBC/Ferritin/ %Sat No results found for: IRON, TIBC, FERRITIN, IRONPCTSAT   EKG Orders placed or performed during the hospital encounter of 09/10/15  . ED EKG within 10 minutes  . ED EKG within 10 minutes  . EKG 12-Lead  . EKG 12-Lead  . EKG     Prior Assessment and Plan Problem List as of 09/21/2015      Cardiovascular and Mediastinum   Non-STEMI (non-ST elevated myocardial infarction) [facility])   Last Assessment & Plan 02/07/2013 Office Visit Written 02/07/2013  3:47 PM by Lendon Colonel, NP    She is recovering well. She has not been able to afford cardiac rehabilitation, but is walking with a friend of hers who is a Clinical research associate, every other day. She states the friend is doing this for free. She plans on losing weight and being more active. She denies any associated discomfort in her chest during exertion. She states she occasionally has some pressure in her chest wall at work which she is attributing now to caffeine and stress. She is going to try and balance her life a little more and she is working 7 days a week, and she owns her own business. She is considering taking a day off or more during the week to help her feel less burdened. I have encouraged her to do this.       Hypertension   Last Assessment & Plan 02/07/2013 Office Visit Written 02/07/2013  3:48 PM by Lendon Colonel, NP    Blood pressure is currently well-controlled. She denies any dizziness or near syncope. She will continue on current medication regimen without changes.  Endocrine   Diabetes mellitus type 2, uncontrolled, with complications ([place])   Hypothyroidism     Other   Morbid obesity ([place])   Hyperlipidemia   Last Assessment & Plan 02/07/2013 Office Visit Written 02/07/2013  3:48 PM by Lendon Colonel, NP    1 a followup labs completed in 3 months, or can be completed by primary care physician Dr. Quintin Alto. She will remain on Lipitor 80 mg daily. She has no complaints of myalgias.      Breast carcinoma [facility])   Surgical menopause       Imaging: Dg Chest 2 View  09/10/2015  CLINICAL DATA:  Pt states she has been having chest pain onset since 2 am. Pt also c/o of chest pain and N/V. Pt states she does have a stent in and states she has a hx of breast cancer. EXAM: CHEST  2 VIEW COMPARISON:  10/03/2012 FINDINGS: The heart size and mediastinal contours are within normal limits. Both lungs are clear. A no pleural effusion or pneumothorax Status post left breast surgery, stable. Bony thorax is demineralized with mild anterior wedging of the T12 vertebrae. This is stable. IMPRESSION: No active cardiopulmonary disease. Electronically Signed   By: Lajean Manes M.D.   On: 09/10/2015 13:13   Nm Myocar Multi W/spect W/wall Motion / Ef  09/15/2015   No diagnostic ST segment changes to indicate ischemia. No arrhythmias. Low risk Duke treadmill score of 5.5.  No significant myocardial perfusion defects to indicate scar or ischemia.  This is a low risk study.  Nuclear stress EF: 70%.

## 2015-09-21 NOTE — Addendum Note (Signed)
Addended by: Debbora Lacrosse R on: 09/21/2015 02:45 PM   Modules accepted: Orders

## 2015-09-21 NOTE — ED Provider Notes (Signed)
CSN: [id number]     Arrival date & time 09/10/15  1249 History   First MD Initiated Contact with Patient 09/10/15 1327     Chief Complaint  Patient presents with  . Chest Pain     (Consider location/radiation/quality/duration/timing/severity/associated sxs/prior Treatment) HPI   55 year old female chest pain. Symptom onset early this morning approximately 2 AM. She is lying in bed at the time symptom onset. Symptoms have been persistent since then. Describes negative several chest. She feels bloated/gassy. She has not noticed anything that seems to make her symptoms better or worse. Respiratory complaints. No fevers or chills. No unusual leg pain or swelling.   Past Medical History  Diagnosis Date  . Hypertension   . Diabetes mellitus type 2 with complications, uncontrolled ([place])   . Hyperlipidemia   . Endometriosis     TAH/BSO in 2003  . Breast carcinoma ([place])   . Basal cell carcinoma 2013    Resected from left neck  . Obesity    Past Surgical History  Procedure Laterality Date  . Mastectomy    . Total abdominal hysterectomy w/ bilateral salpingoophorectomy  2003  . Skin cancer excision  2013    Basal cell-left neck  . Left heart catheterization with coronary angiogram N/A 10/05/2012    Procedure: LEFT HEART CATHETERIZATION WITH CORONARY ANGIOGRAM;  Surgeon: Peter M Martinique, MD;  Location[facility] CATH LAB;  Service: Cardiovascular;  Laterality: N/A;  . Percutaneous coronary stent intervention (pci-s)  10/05/2012    Procedure: PERCUTANEOUS CORONARY STENT INTERVENTION (PCI-S);  Surgeon: Peter M Martinique, MD;  Location: [facility] CATH LAB;  Service: Cardiovascular;;   History reviewed. No pertinent family history. Social History  Substance Use Topics  . Smoking status: Former Smoker -- 1.50 packs/day for 10 years    Types: Cigarettes    Start date: 05/17/1975    Quit date: 09/03/1989  . Smokeless tobacco: Never Used  . Alcohol Use: 0.0 oz/week    0 Standard drinks or equivalent per week      Comment: occasional   OB History    No data available     Review of Systems  All systems reviewed and negative, other than as noted in HPI.   Allergies  Codeine; Demerol; Dilaudid; Morphine and related; and Atorvastatin  Home Medications   Prior to Admission medications   Medication Sig Start Date End Date Taking? Authorizing Provider  citalopram (CELEXA) 40 MG tablet Take 40 mg by mouth daily.   Yes Historical Provider, MD  levothyroxine (SYNTHROID, LEVOTHROID) 125 MCG tablet Take 125 mcg by mouth daily before breakfast.   Yes Historical Provider, MD  lisinopril (PRINIVIL,ZESTRIL) 10 MG tablet Take 10 mg by mouth daily.   Yes Historical Provider, MD  metoprolol tartrate (LOPRESSOR) 25 MG tablet Take 0.5 tablets (12.5 mg total) by mouth 2 (two) times daily. 02/13/14  Yes Arnoldo Lenis, MD  rosuvastatin (CRESTOR) 5 MG tablet Take 1 tablet (5 mg total) by mouth daily. 08/06/15  Yes Arnoldo Lenis, MD  aspirin EC 81 MG tablet Take 81 mg by mouth daily.    Historical Provider, MD  metFORMIN (GLUCOPHAGE) 500 MG tablet Take 500 mg by mouth 2 (two) times daily with a meal. Takes 1250 mg daily    Historical Provider, MD  nitroGLYCERIN (NITROSTAT) 0.4 MG SL tablet Place 1 tablet (0.4 mg total) under the tongue every 5 (five) minutes as needed for chest pain. 09/14/15   Lendon Colonel, NP  omeprazole (PRILOSEC) 20 MG capsule Take 20  mg by mouth daily.    Historical Provider, MD   BP 143/85 mmHg  Pulse 92  Temp(Src) 98.2 F (36.8 C) (Oral)  Resp 23  Ht 5\' 4"  (1.626 m)  Wt 195 lb (88.451 kg)  BMI 33.46 kg/m2  SpO2 95% Physical Exam  Constitutional: She appears well-developed and well-nourished. No distress.  HENT:  Head: Normocephalic and atraumatic.  Eyes: Conjunctivae are normal. Right eye exhibits no discharge. Left eye exhibits no discharge.  Neck: Neck supple.  Cardiovascular: Normal rate, regular rhythm and normal heart sounds.  Exam reveals no gallop and no friction  rub.   No murmur heard. Pulmonary/Chest: Effort normal and breath sounds normal. No respiratory distress. She exhibits no tenderness.  Abdominal: Soft. She exhibits no distension. There is no tenderness.  Musculoskeletal: She exhibits no edema or tenderness.  Lower extremities symmetric as compared to each other. No calf tenderness. Negative Homan's. No palpable cords.   Neurological: She is alert.  Skin: Skin is warm and dry.  Psychiatric: She has a normal mood and affect. Her behavior is normal. Thought content normal.  Nursing note and vitals reviewed.   ED Course  Procedures (including critical care time) Labs Review Labs Reviewed  BASIC METABOLIC PANEL - Abnormal; Notable for the following:    CO2 21 (*)    Glucose, Bld 357 (*)    All other components within normal limits  CBC - Abnormal; Notable for the following:    WBC 11.4 (*)    All other components within normal limits  TROPONIN I    Imaging Review No results found. I have personally reviewed and evaluated these images and lab results as part of my medical decision-making.   EKG Interpretation   Date/Time:  Thursday September 10 2015 12:55:52 EDT Ventricular Rate:  95 PR Interval:  138 QRS Duration: 76 QT Interval:  372 QTC Calculation: 467 R Axis:   -9 Text Interpretation:  Normal sinus rhythm Nonspecific T wave abnormality  Abnormal ECG Confirmed by Wilson Singer  MD, Yuridia Couts ([id number]) on 09/10/2015 1:32:16  PM      MDM   Final diagnoses:  Chest pain, unspecified chest pain type  Epigastric pain  Nausea    55 year old female chest pain. Atypical for ACS. Since onset. Symptoms since around 2:00 this morning. Troponin normal partial 12 hours after symptom onset. She is in no distress. EKG without acute ischemic appearing changes. She is afebrile. No increased work of breathing. Doubt PE or dissection. Chest x-ray without acute abnormality.    Virgel Manifold, MD 09/21/15 [phone]

## 2015-09-21 NOTE — Patient Instructions (Signed)
Medication Instructions:  Your physician recommends that you continue on your current medications as directed. Please refer to the Current Medication list given to you today.   Labwork: NONE  Testing/Procedures: GALL BLADDER ULTRA SOUND   Follow-Up: Your physician wants you to follow-up in: 6 MONTHS .  You will receive a reminder letter in the mail two months in advance. If you don't receive a letter, please call our office to schedule the follow-up appointment.   Any Other Special Instructions Will Be Listed Below (If Applicable).     If you need a refill on your cardiac medications before your next appointment, please call your pharmacy.

## 2015-09-24 ENCOUNTER — Ambulatory Visit (HOSPITAL_COMMUNITY)
Admission: RE | Admit: 2015-09-24 | Discharge: 2015-09-24 | Disposition: A | Discharge status: Home / Self Care | Payer: BLUE CROSS/BLUE SHIELD | Source: Ambulatory Visit | Attending: Adult Health | Admitting: Adult Health

## 2015-09-24 DIAGNOSIS — K8020 Calculus of gallbladder without cholecystitis without obstruction: Secondary | ICD-10-CM | POA: Insufficient documentation

## 2015-09-24 DIAGNOSIS — K760 Fatty (change of) liver, not elsewhere classified: Secondary | ICD-10-CM | POA: Diagnosis not present

## 2015-09-24 DIAGNOSIS — R109 Unspecified abdominal pain: Secondary | ICD-10-CM | POA: Diagnosis not present

## 2015-09-24 DIAGNOSIS — R0789 Other chest pain: Secondary | ICD-10-CM

## 2015-10-06 ENCOUNTER — Telehealth: Payer: Self-pay | Admitting: Adult Health

## 2015-10-06 NOTE — Telephone Encounter (Signed)
Results of galbladder u/s / tg

## 2015-10-06 NOTE — Telephone Encounter (Signed)
Patient asking for referral to surgeon because her pcp "didn't see anything" on [place] results,will forward to [place] NP

## 2015-10-09 ENCOUNTER — Telehealth: Payer: Self-pay

## 2015-10-09 DIAGNOSIS — K8010 Calculus of gallbladder with chronic cholecystitis without obstruction: Secondary | ICD-10-CM

## 2015-10-09 NOTE — Telephone Encounter (Signed)
CLINICAL DATA: Abdominal pain.  EXAM: US ABDOMEN LIMITED - RIGHT UPPER QUADRANT  COMPARISON: 10/04/2012.  FINDINGS: Gallbladder:  Multiple calculi are present. Largest measures up to 1 cm. Some layering sludge is noted. No gallbladder wall thickening or tenderness.  Common bile duct:  Diameter: Normal measuring 4.7 mm  Liver:  Increased echogenicity without focal defects consistent with steatosis.  IMPRESSION: Cholelithiasis without signs of acute cholecystitis.  Hepatic steatosis.   Electronically Signed  By: Staci Righter M.D.  On: 09/24/2015 08:23    Ref placed to GI

## 2015-10-09 NOTE — Telephone Encounter (Signed)
-----   Message from Lendon Colonel, NP sent at 10/08/2015  7:03 AM EDT ----- Regarding: RE: [place] results Why don't we have her see GI first. They may want to do an ERCP or further testing before surgical consult. She may ultimately go to see surgeon, but may need them to evaluate her first.  Can see [facility].  ----- Message -----    From: Bernita Raisin, RN    Sent: 10/06/2015   3:02 PM      To: Lendon Colonel, NP Subject: [place] results                                     Pt states pcp will not send her to a surgeon and he saw nothing on [place] results.Her insurance will not allow her to self refer.she was calling asking if we could refer her

## 2015-10-23 ENCOUNTER — Other Ambulatory Visit: Payer: Self-pay | Admitting: General Surgery

## 2015-10-23 DIAGNOSIS — K8020 Calculus of gallbladder without cholecystitis without obstruction: Secondary | ICD-10-CM | POA: Diagnosis not present

## 2015-11-07 NOTE — Pre-Procedure Instructions (Signed)
[name]  11/07/2015      [facility] - [place], [place] - [address] AT [place] [address] Phone: [phone] Fax: [phone]  [facility] [place], [place] [address] Phone: [phone] Fax: [phone]  [facility] [place], [place] [address] [place] [address] Phone: [phone] Fax: [phone]    Your procedure is scheduled on Thursday, June 29th   Report to [facility] Admitting at 9:00 AM             (posted surgery time 11:00 am - 12:22 am)   Call this number if you have problems the morning of surgery:  [phone]   Remember:  Do not eat food or drink liquids after midnight Wednesday.   Take these medicines the morning of surgery with A SIP OF WATER : Celexa, Levothyroid, Lopressor.             DO NOT TAKE any diabetes medication the morning of surgery.              4-5 days prior to surgery, STOP taking aspirin, anti-inflammatories, herbal supplements, vitamins   Do not wear jewelry, make-up or nail polish.  Do not wear lotions, powders, or perfumes.     Do not shave underarms & legs 48 hours prior to surgery.    Do not bring valuables to the hospital.  [facility] is not responsible for any belongings or valuables.  Contacts, dentures or bridgework may not be worn into surgery.  Leave your suitcase in the car.  After surgery it may be brought to your room. For patients admitted to the hospital, discharge time will be determined by your treatment team. Patients discharged the day of surgery will not be allowed to drive home.   Name and phone number of your driver:     Please read over the following fact sheets that you were given. Pain Booklet and Surgical Site Infection Prevention

## 2015-11-09 ENCOUNTER — Encounter (HOSPITAL_COMMUNITY)
Admission: RE | Admit: 2015-11-09 | Discharge: 2015-11-09 | Disposition: A | Discharge status: Home / Self Care | Payer: BLUE CROSS/BLUE SHIELD | Source: Ambulatory Visit | Attending: General Surgery | Admitting: General Surgery

## 2015-11-09 ENCOUNTER — Encounter (HOSPITAL_COMMUNITY): Payer: Self-pay

## 2015-11-09 DIAGNOSIS — Z7982 Long term (current) use of aspirin: Secondary | ICD-10-CM | POA: Diagnosis not present

## 2015-11-09 DIAGNOSIS — Z87891 Personal history of nicotine dependence: Secondary | ICD-10-CM | POA: Insufficient documentation

## 2015-11-09 DIAGNOSIS — I10 Essential (primary) hypertension: Secondary | ICD-10-CM | POA: Insufficient documentation

## 2015-11-09 DIAGNOSIS — Z955 Presence of coronary angioplasty implant and graft: Secondary | ICD-10-CM | POA: Diagnosis not present

## 2015-11-09 DIAGNOSIS — E119 Type 2 diabetes mellitus without complications: Secondary | ICD-10-CM | POA: Diagnosis not present

## 2015-11-09 DIAGNOSIS — Z7984 Long term (current) use of oral hypoglycemic drugs: Secondary | ICD-10-CM | POA: Insufficient documentation

## 2015-11-09 DIAGNOSIS — Z01812 Encounter for preprocedural laboratory examination: Secondary | ICD-10-CM | POA: Diagnosis not present

## 2015-11-09 DIAGNOSIS — E785 Hyperlipidemia, unspecified: Secondary | ICD-10-CM | POA: Diagnosis not present

## 2015-11-09 DIAGNOSIS — Z01818 Encounter for other preprocedural examination: Secondary | ICD-10-CM | POA: Insufficient documentation

## 2015-11-09 DIAGNOSIS — Z79899 Other long term (current) drug therapy: Secondary | ICD-10-CM | POA: Diagnosis not present

## 2015-11-09 DIAGNOSIS — I2510 Atherosclerotic heart disease of native coronary artery without angina pectoris: Secondary | ICD-10-CM | POA: Insufficient documentation

## 2015-11-09 HISTORY — DX: Other specified postprocedural states: Z98.890

## 2015-11-09 HISTORY — DX: Other specified postprocedural states: R11.2

## 2015-11-09 HISTORY — DX: Adverse effect of unspecified anesthetic, initial encounter: T41.45XA

## 2015-11-09 HISTORY — DX: Atherosclerotic heart disease of native coronary artery without angina pectoris: I25.10

## 2015-11-09 HISTORY — DX: Other complications of anesthesia, initial encounter: T88.59XA

## 2015-11-09 LAB — COMPREHENSIVE METABOLIC PANEL
ALBUMIN: 3.9 g/dL (ref 3.5–5.0)
ALK PHOS: 89 U/L (ref 38–126)
ALT: 23 U/L (ref 14–54)
AST: 32 U/L (ref 15–41)
Anion gap: 6 (ref 5–15)
BILIRUBIN TOTAL: 0.4 mg/dL (ref 0.3–1.2)
BUN: 11 mg/dL (ref 6–20)
CALCIUM: 9.4 mg/dL (ref 8.9–10.3)
CO2: 27 mmol/L (ref 22–32)
Chloride: 103 mmol/L (ref 101–111)
Creatinine, Ser: 0.59 mg/dL (ref 0.44–1.00)
GFR calc Af Amer: >60 mL/min (ref >60)
GFR calc non Af Amer: >60 mL/min (ref >60)
GLUCOSE: 297 mg/dL — AB (ref 65–99)
Potassium: 4.4 mmol/L (ref 3.5–5.1)
Sodium: 136 mmol/L (ref 135–145)
TOTAL PROTEIN: 6.7 g/dL (ref 6.5–8.1)

## 2015-11-09 LAB — URINALYSIS, ROUTINE W REFLEX MICROSCOPIC
Bilirubin Urine: NEGATIVE
HGB URINE DIPSTICK: NEGATIVE
KETONES UR: NEGATIVE mg/dL
Leukocytes, UA: NEGATIVE
Nitrite: NEGATIVE
PH: 5 (ref 5.0–8.0)
PROTEIN: NEGATIVE mg/dL
Specific Gravity, Urine: 1.042 — ABNORMAL HIGH (ref 1.005–1.030)

## 2015-11-09 LAB — CBC WITH DIFFERENTIAL/PLATELET
BASOS ABS: 0 10*3/uL (ref 0.0–0.1)
BASOS PCT: 0 %
Eosinophils Absolute: 0.2 10*3/uL (ref 0.0–0.7)
Eosinophils Relative: 2 %
HEMATOCRIT: 41 % (ref 36.0–46.0)
HEMOGLOBIN: 13.5 g/dL (ref 12.0–15.0)
Lymphocytes Relative: 39 %
Lymphs Abs: 3.9 10*3/uL (ref 0.7–4.0)
MCH: 29.2 pg (ref 26.0–34.0)
MCHC: 32.9 g/dL (ref 30.0–36.0)
MCV: 88.7 fL (ref 78.0–100.0)
Monocytes Absolute: 0.3 10*3/uL (ref 0.1–1.0)
Monocytes Relative: 3 %
NEUTROS ABS: 5.7 10*3/uL (ref 1.7–7.7)
NEUTROS PCT: 56 %
Platelets: 215 10*3/uL (ref 150–400)
RBC: 4.62 MIL/uL (ref 3.87–5.11)
RDW: 12.6 % (ref 11.5–15.5)
WBC: 10.2 10*3/uL (ref 4.0–10.5)

## 2015-11-09 LAB — GLUCOSE, CAPILLARY: Glucose-Capillary: 319 mg/dL — ABNORMAL HIGH (ref 65–99)

## 2015-11-09 LAB — URINE MICROSCOPIC-ADD ON

## 2015-11-09 NOTE — Progress Notes (Addendum)
PCP is Dr. Consuello Masse.  LOV May 2017.  She originally went to him when she started having chest discomfort. Was then sent to her Cardio -  Dr. Zandra Abts, who did a nuclear stress, echo, etc (per the patient) Found out it was her gallbladder. She states that her morning blood sugars range from 250-300 (usually gets up around 8:30 am)  Instructed her to hold am metformin.

## 2015-11-10 ENCOUNTER — Encounter (HOSPITAL_COMMUNITY): Payer: Self-pay | Admitting: Emergency Medicine

## 2015-11-10 ENCOUNTER — Encounter (HOSPITAL_COMMUNITY): Payer: Self-pay

## 2015-11-10 LAB — HEMOGLOBIN A1C
Hgb A1c MFr Bld: 12.7 % — ABNORMAL HIGH (ref 4.8–5.6)
Mean Plasma Glucose: 318 mg/dL

## 2015-11-10 NOTE — Progress Notes (Addendum)
Anesthesia Chart Review:  Pt is a 55 year old female scheduled for laparoscopic cholecystectomy with possible intraoperative cholangiogram on 11/12/2015 with Stark Klein, MD  PCP is Consuello Masse, MD. Cardiologist is Carlyle Dolly, MD, last office visit 09/21/15 with Jory Sims, NP.   PMH includes:  CAD (DES to LAD 2014), HTN, DM, hyperlipidemia, post-op N/V. Former smoker. BMI 34  Medication include: ASA, levothyroxine, lisinopril, metformin, metoprolol, crestor  Preoperative labs reviewed.  HgbA1c 12.7, glucose 297  Chest x-ray 09/10/15 reviewed. No active cardiopulmonary disease  EKG 09/10/15: NSR. Nonspecific T wave abnormality  Nuclear stress test 09/15/15:   No diagnostic ST segment changes to indicate ischemia. No arrhythmias. Low risk Duke treadmill score of 5.5.  No significant myocardial perfusion defects to indicate scar or ischemia.  This is a low risk study.  Nuclear stress EF: 70%.  Echo 10/04/12:  - Left ventricle: Wall thickness was increased in a pattern of mild LVH with disproportionate septal thickening. Systolic function was normal. The estimated ejection fraction was in the range of 55% to 60%. Wall motion was normal; there were no regional wall motion abnormalities. - Aortic valve: Mildly calcified annulus. Trileaflet; mildly thickened leaflets. - Left atrium: The atrium was mildly dilated. - Atrial septum: No defect or patent foramen ovale was identified. - Pulmonary arteries: PA peak pressure: 31mm Hg (S).  Cardiac cath 10/06/15:  1. Single vessel obstructive CAD with high grade mid LAD stenosis. 2. LM normal, LCx <10% luminal irregularity, proximal RCA <20% luminal irregularity 3. Well preserved LV systolic function with anterior hypokinesis. 4. Successful stenting of the mid LAD with DES.   Pt reported at PAT her usual fasting blood sugars range 250-300. I notified Abigail Butts in Dr. Marlowe Aschoff office of pt's uncontrolled DM and that unless surgery is  urgent/emergent, case potentially could be cancelled due to high blood glucose.   Willeen Cass, FNP-BC [facility]/Anesthesiology Phone: [phone] 11/10/2015 1:23 PM

## 2015-11-11 DIAGNOSIS — E1165 Type 2 diabetes mellitus with hyperglycemia: Secondary | ICD-10-CM | POA: Diagnosis not present

## 2015-11-11 DIAGNOSIS — Z6835 Body mass index (BMI) 35.0-35.9, adult: Secondary | ICD-10-CM | POA: Diagnosis not present

## 2015-11-11 DIAGNOSIS — Z1231 Encounter for screening mammogram for malignant neoplasm of breast: Secondary | ICD-10-CM | POA: Diagnosis not present

## 2015-11-11 MED ORDER — CEFAZOLIN SODIUM-DEXTROSE 2-4 GM/100ML-% IV SOLN: 2.0000 g | INTRAVENOUS | Status: AC

## 2015-11-12 ENCOUNTER — Encounter (HOSPITAL_COMMUNITY): Admission: RE | Payer: Self-pay | Source: Ambulatory Visit

## 2015-11-12 ENCOUNTER — Ambulatory Visit (HOSPITAL_COMMUNITY)
Admission: RE | Admit: 2015-11-12 | Payer: BLUE CROSS/BLUE SHIELD | Source: Ambulatory Visit | Admitting: General Surgery

## 2015-11-12 SURGERY — LAPAROSCOPIC CHOLECYSTECTOMY WITH INTRAOPERATIVE CHOLANGIOGRAM
Anesthesia: General

## 2015-12-09 DIAGNOSIS — I10 Essential (primary) hypertension: Secondary | ICD-10-CM | POA: Diagnosis not present

## 2015-12-09 DIAGNOSIS — K210 Gastro-esophageal reflux disease with esophagitis: Secondary | ICD-10-CM | POA: Diagnosis not present

## 2015-12-09 DIAGNOSIS — E782 Mixed hyperlipidemia: Secondary | ICD-10-CM | POA: Diagnosis not present

## 2015-12-09 DIAGNOSIS — E1165 Type 2 diabetes mellitus with hyperglycemia: Secondary | ICD-10-CM | POA: Diagnosis not present

## 2015-12-15 DIAGNOSIS — E1165 Type 2 diabetes mellitus with hyperglycemia: Secondary | ICD-10-CM | POA: Diagnosis not present

## 2015-12-15 DIAGNOSIS — F411 Generalized anxiety disorder: Secondary | ICD-10-CM | POA: Diagnosis not present

## 2015-12-15 DIAGNOSIS — E782 Mixed hyperlipidemia: Secondary | ICD-10-CM | POA: Diagnosis not present

## 2015-12-15 DIAGNOSIS — F3341 Major depressive disorder, recurrent, in partial remission: Secondary | ICD-10-CM | POA: Diagnosis not present

## 2016-02-15 DIAGNOSIS — M720 Palmar fascial fibromatosis [Dupuytren]: Secondary | ICD-10-CM | POA: Diagnosis not present

## 2016-02-18 DIAGNOSIS — F3342 Major depressive disorder, recurrent, in full remission: Secondary | ICD-10-CM | POA: Diagnosis not present

## 2016-03-09 DIAGNOSIS — E782 Mixed hyperlipidemia: Secondary | ICD-10-CM | POA: Diagnosis not present

## 2016-03-09 DIAGNOSIS — E039 Hypothyroidism, unspecified: Secondary | ICD-10-CM | POA: Diagnosis not present

## 2016-03-09 DIAGNOSIS — Z0000 Encounter for general adult medical examination without abnormal findings: Secondary | ICD-10-CM | POA: Diagnosis not present

## 2016-03-09 DIAGNOSIS — I10 Essential (primary) hypertension: Secondary | ICD-10-CM | POA: Diagnosis not present

## 2016-03-09 DIAGNOSIS — E1165 Type 2 diabetes mellitus with hyperglycemia: Secondary | ICD-10-CM | POA: Diagnosis not present

## 2016-03-15 DIAGNOSIS — Z6832 Body mass index (BMI) 32.0-32.9, adult: Secondary | ICD-10-CM | POA: Diagnosis not present

## 2016-03-15 DIAGNOSIS — Z1212 Encounter for screening for malignant neoplasm of rectum: Secondary | ICD-10-CM | POA: Diagnosis not present

## 2016-03-15 DIAGNOSIS — Z0001 Encounter for general adult medical examination with abnormal findings: Secondary | ICD-10-CM | POA: Diagnosis not present

## 2016-03-15 DIAGNOSIS — Z1389 Encounter for screening for other disorder: Secondary | ICD-10-CM | POA: Diagnosis not present

## 2016-04-18 ENCOUNTER — Encounter: Payer: Self-pay | Admitting: *Deleted

## 2016-04-18 ENCOUNTER — Ambulatory Visit (INDEPENDENT_AMBULATORY_CARE_PROVIDER_SITE_OTHER): Payer: BLUE CROSS/BLUE SHIELD | Admitting: Cardiology

## 2016-04-18 ENCOUNTER — Encounter: Payer: Self-pay | Admitting: Cardiology

## 2016-04-18 VITALS — BP 124/76 | HR 75 | Ht 64.0 in | Wt 183.0 lb

## 2016-04-18 DIAGNOSIS — I10 Essential (primary) hypertension: Secondary | ICD-10-CM

## 2016-04-18 DIAGNOSIS — I2510 Atherosclerotic heart disease of native coronary artery without angina pectoris: Secondary | ICD-10-CM | POA: Diagnosis not present

## 2016-04-18 DIAGNOSIS — E782 Mixed hyperlipidemia: Secondary | ICD-10-CM | POA: Diagnosis not present

## 2016-04-18 NOTE — Patient Instructions (Signed)
Your physician wants you to follow-up in: 6 Months with Dr. Branch. You will receive a reminder letter in the mail two months in advance. If you don't receive a letter, please call our office to schedule the follow-up appointment.  Your physician recommends that you continue on your current medications as directed. Please refer to the Current Medication list given to you today.  If you need a refill on your cardiac medications before your next appointment, please call your pharmacy.  Thank you for choosing [facility]!   

## 2016-04-18 NOTE — Progress Notes (Signed)
Clinical Summary Ms. Mcbrayer is a 55 y.o.female seen today for follow up of the following medical problems.    1. CAD - prior DES to LAD 09/2012, LVEF 55% by LVgram at that time. -  09/2012 echo LVEF 55-60%  - 09/2015 exercise nuclear stress test without ischemia, low risk Duke treadmill score of 5.5.  Chest pain symptoms a few months ago thought to be gallbladder related, but has not been removed yet due to hyperglycemia per her report. Reports occasional discomfort, sharp pain epigastric, often after meals - compliant with meds   2. Hyperlipidemia - nausea on atorva, previously changed to simva and later changed to crestor - tolerating crestor well - reports recent labs with primary  3. HTN - does not check at home - she is compliant with meds  4. DM2 - followed by pcp Dr Quintin Alto    [facility]: husband has metastatic melanoma, being treated [facility].  Past Medical History:  Diagnosis Date  . Basal cell carcinoma 2013   Resected from left neck  . Breast carcinoma ([place])   . Complication of anesthesia   . Coronary artery disease    DES to LAD 2014  . Diabetes mellitus type 2 with complications, uncontrolled (HCC)    type 2  -- pills only  . Endometriosis    TAH/BSO in 2003  . Hyperlipidemia   . Hypertension   . Obesity   . PONV (postoperative nausea and vomiting)    severe--wants ? scop patch & drugs     Allergies  Allergen Reactions  . Codeine Nausea And Vomiting    "deathly sick"  . Demerol [Meperidine]     Pt states all pain meds make her sick on her stomach  . Dilaudid [Hydromorphone Hcl] Nausea And Vomiting  . Morphine And Related Nausea And Vomiting  . Atorvastatin Nausea And Vomiting     Current Outpatient Prescriptions  Medication Sig Dispense Refill  . aspirin EC 81 MG tablet Take 81 mg by mouth daily.    . citalopram (CELEXA) 40 MG tablet Take 40 mg by mouth daily.    [name] levothyroxine (SYNTHROID, LEVOTHROID) 125 MCG tablet Take 125 mcg by mouth  daily before breakfast.    . lisinopril (PRINIVIL,ZESTRIL) 10 MG tablet Take 10 mg by mouth daily.    . metFORMIN (GLUCOPHAGE) 500 MG tablet Take 500 mg by mouth 2 (two) times daily with a meal. Pt takes 1/2 tablet in the AM and two tablets in the PM    . metoprolol tartrate (LOPRESSOR) 25 MG tablet Take 0.5 tablets (12.5 mg total) by mouth 2 (two) times daily. 90 tablet 3  . nitroGLYCERIN (NITROSTAT) 0.4 MG SL tablet Place 1 tablet (0.4 mg total) under the tongue every 5 (five) minutes as needed for chest pain. 75 tablet 0  . rosuvastatin (CRESTOR) 5 MG tablet Take 1 tablet (5 mg total) by mouth daily. 90 tablet 3   No current facility-administered medications for this visit.      Past Surgical History:  Procedure Laterality Date  . ABDOMINAL HYSTERECTOMY    . BREAST SURGERY    . CARDIAC CATHETERIZATION    . EYE SURGERY     cataract left eye  . LEFT HEART CATHETERIZATION WITH CORONARY ANGIOGRAM N/A 10/05/2012   Procedure: LEFT HEART CATHETERIZATION WITH CORONARY ANGIOGRAM;  Surgeon: Peter M Martinique, MD;  Location: [facility] CATH LAB;  Service: Cardiovascular;  Laterality: N/A;  . MASTECTOMY     left  . PERCUTANEOUS CORONARY STENT  INTERVENTION (PCI-S)  10/05/2012   Procedure: PERCUTANEOUS CORONARY STENT INTERVENTION (PCI-S);  Surgeon: Peter M Martinique, MD;  Location: [facility] CATH LAB;  Service: Cardiovascular;;  . SKIN CANCER EXCISION  2013   Basal cell-left neck  . TOTAL ABDOMINAL HYSTERECTOMY W/ BILATERAL SALPINGOOPHORECTOMY  2003     Allergies  Allergen Reactions  . Codeine Nausea And Vomiting    "deathly sick"  . Demerol [Meperidine]     Pt states all pain meds make her sick on her stomach  . Dilaudid [Hydromorphone Hcl] Nausea And Vomiting  . Morphine And Related Nausea And Vomiting  . Atorvastatin Nausea And Vomiting      No family history on file.   Social History Ms. Floria reports that she quit smoking about 26 years ago. Her smoking use included Cigarettes. She started  smoking about 40 years ago. She has a 15.00 pack-year smoking history. She has never used smokeless tobacco. Ms. Neifert reports that she drinks alcohol.   Review of Systems CONSTITUTIONAL: No weight loss, fever, chills, weakness or fatigue.  HEENT: Eyes: No visual loss, blurred vision, double vision or yellow sclerae.No hearing loss, sneezing, congestion, runny nose or sore throat.  SKIN: No rash or itching.  CARDIOVASCULAR: per HPI RESPIRATORY: No shortness of breath, cough or sputum.  GASTROINTESTINAL: No anorexia, nausea, vomiting or diarrhea. No abdominal pain or blood.  GENITOURINARY: No burning on urination, no polyuria NEUROLOGICAL: No headache, dizziness, syncope, paralysis, ataxia, numbness or tingling in the extremities. No change in bowel or bladder control.  MUSCULOSKELETAL: No muscle, back pain, joint pain or stiffness.  LYMPHATICS: No enlarged nodes. No history of splenectomy.  PSYCHIATRIC: No history of depression or anxiety.  ENDOCRINOLOGIC: No reports of sweating, cold or heat intolerance. No polyuria or polydipsia.  [name]   Physical Examination Vitals:   04/18/16 1201  BP: 124/76  Pulse: 75   Vitals:   04/18/16 1201  Weight: 183 lb (83 kg)  Height: 5\' 4"  (1.626 m)    Gen: resting comfortably, no acute distress HEENT: no scleral icterus, pupils equal round and reactive, no palptable cervical adenopathy,  CV: RRR, no m/r/g, no jvd Resp: Clear to auscultation bilaterally GI: abdomen is soft, non-tender, non-distended, normal bowel sounds, no hepatosplenomegaly MSK: extremities are warm, no edema.  Skin: warm, no rash Neuro:  no focal deficits Psych: appropriate affect   Diagnostic Studies  09/2012 Cath PROCEDURAL FINDINGS  Hemodynamics:  AO 129/82 mean 104 mm Hg  LV 132/22 mm Hg  Coronary angiography:  Coronary dominance: right  Left mainstem: Normal  Left anterior descending (LAD): 90% mid LAD with moderate calcification. The distal LAD has diffuse  irregularities.  Left circumflex (LCx): less than 10% luminal irregularity  Right coronary artery (RCA): Mild proximal irregularity less than 20%.  Left ventriculography: Left ventricular systolic function is normal, LVEF is estimated at 55%, there is mild mid to distal anterior wall hypokinesis, there is no significant mitral regurgitation  PCI Note: Following the diagnostic procedure, the decision was made to proceed with PCI. Weight-based bivalirudin was given for anticoagulation. Effient 60 mg was given orally. Once a therapeutic ACT was achieved, a 6 [place] XBLAD 3.5 guide catheter was inserted. A prowater coronary guidewire was used to cross the lesion. The lesion was predilated with a 2.5 mm compliant balloon. The lesion was then stented with a 3.5 x 28 mm Promus premier stent. The stent was postdilated with a 3.75 mm noncompliant balloon. Following PCI, there was 0% residual stenosis and TIMI-3 flow. Final  angiography confirmed an excellent result. The patient tolerated the procedure well. There were no immediate procedural complications. A TR band was used for radial hemostasis. The patient was transferred to the post catheterization recovery area for further monitoring.  PCI Data:  Vessel - LAD/Segment - mid  Percent Stenosis (pre) 90%  TIMI-flow 3  Stent 3.5 x 28 mm Promus premier  Percent Stenosis (post) 0%  TIMI-flow (post) 3   Final Conclusions:  1. Single vessel obstructive CAD with high grade mid LAD stenosis.  2. Well preserved LV systolic function with anterior hypokinesis.  3. Successful stenting of the mid LAD with DES.  Recommendations:  Continue dual antiplatelet therapy for one year. Risk factor modification.   09/2012 Echo Study Conclusions  - Left ventricle: Wall thickness was increased in a pattern of mild LVH with disproportionate septal thickening. Systolic function was normal. The estimated ejection fraction was in the range of 55% to 60%. Wall motion  was normal; there were no regional wall motion abnormalities. - Aortic valve: Mildly calcified annulus. Trileaflet; mildly thickened leaflets. - Left atrium: The atrium was mildly dilated. - Atrial septum: No defect or patent foramen ovale was identified. - Pulmonary arteries: PA peak pressure: 6mm Hg (S).   02/13/14 Clinic EKG NSR  09/2015 Nuclear stress test  No diagnostic ST segment changes to indicate ischemia. No arrhythmias. Low risk Duke treadmill score of 5.5.  No significant myocardial perfusion defects to indicate scar or ischemia.  This is a low risk study.  Nuclear stress EF: 70%.    Assessment and Plan  1. CAD - current symptoms most consistent with ongoing gallbladder issues. Symptoms noncardiac in description, recent negative nuclear stress test - continue current meds  2. HTN - she is at goal, continue current meds  3. DM type II - per Dr Quintin Alto  4. Hyperlipidemia - request recent labs from pcp - continue statin.       Arnoldo Lenis, M.D.

## 2016-05-13 DIAGNOSIS — E039 Hypothyroidism, unspecified: Secondary | ICD-10-CM | POA: Diagnosis not present

## 2016-06-13 ENCOUNTER — Other Ambulatory Visit: Payer: Self-pay | Admitting: General Surgery

## 2016-06-21 NOTE — Patient Instructions (Signed)
[name]  06/21/2016   Your procedure is scheduled on: 06/28/16  Report to [facility] Main  Entrance take [facility]  elevators to 3rd floor to  [place] at     1000AM.  Call this number if you have problems the morning of surgery [phone]   Remember: ONLY 1 PERSON MAY GO WITH YOU TO SHORT STAY TO GET  READY MORNING OF [place].  Do not eat food or drink liquids :After Midnight.     Take these medicines the morning of surgery with A SIP OF WATER:Synthroid, Metoprolol            DO NOT TAKE ANY DIABETIC MEDICATIONS DAY OF YOUR SURGERY                               You may not have any metal on your body including hair pins and              piercings  Do not wear jewelry, make-up, lotions, powders or perfumes, deodorant             Do not wear nail polish.  Do not shave  48 hours prior to surgery.                 Do not bring valuables to the hospital. [name].  Contacts, dentures or bridgework may not be worn into surgery.     Patients discharged the day of surgery will not be allowed to drive home.  Name and phone number of your driver:  Special Instructions: N/A              Please read over the following fact sheets you were given: _____________________________________________________________________             [facility] - Preparing for Surgery Before surgery, you can play an important role.  Because skin is not sterile, your skin needs to be as free of germs as possible.  You can reduce the number of germs on your skin by washing with CHG (chlorahexidine gluconate) soap before surgery.  CHG is an antiseptic cleaner which kills germs and bonds with the skin to continue killing germs even after washing. Please DO NOT use if you have an allergy to CHG or antibacterial soaps.  If your skin becomes reddened/irritated stop using the CHG and inform your nurse when you arrive at Short  Stay. Do not shave (including legs and underarms) for at least 48 hours prior to the first CHG shower.  You may shave your face/neck. Please follow these instructions carefully:  1.  Shower with CHG Soap the night before surgery and the  morning of Surgery.  2.  If you choose to wash your hair, wash your hair first as usual with your  normal  shampoo.  3.  After you shampoo, rinse your hair and body thoroughly to remove the  shampoo.                           4.  Use CHG as you would any other liquid soap.  You can apply chg directly  to the skin and wash  Gently with a scrungie or clean washcloth.  5.  Apply the CHG Soap to your body ONLY FROM THE NECK DOWN.   Do not use on face/ open                           Wound or open sores. Avoid contact with eyes, ears mouth and genitals (private parts).                       Wash face,  Genitals (private parts) with your normal soap.             6.  Wash thoroughly, paying special attention to the area where your surgery  will be performed.  7.  Thoroughly rinse your body with warm water from the neck down.  8.  DO NOT shower/wash with your normal soap after using and rinsing off  the CHG Soap.                9.  Pat yourself dry with a clean towel.            10.  Wear clean pajamas.            11.  Place clean sheets on your bed the night of your first shower and do not  sleep with pets. Day of Surgery : Do not apply any lotions/deodorants the morning of surgery.  Please wear clean clothes to the hospital/surgery center.  FAILURE TO FOLLOW THESE INSTRUCTIONS MAY RESULT IN THE CANCELLATION OF YOUR SURGERY PATIENT SIGNATURE_________________________________  NURSE SIGNATURE__________________________________  ________________________________________________________________________

## 2016-06-23 ENCOUNTER — Encounter (HOSPITAL_COMMUNITY): Payer: Self-pay

## 2016-06-23 ENCOUNTER — Encounter (HOSPITAL_COMMUNITY)
Admission: RE | Admit: 2016-06-23 | Discharge: 2016-06-23 | Disposition: A | Discharge status: Home / Self Care | Payer: BLUE CROSS/BLUE SHIELD | Source: Ambulatory Visit | Attending: General Surgery | Admitting: General Surgery

## 2016-06-23 DIAGNOSIS — E119 Type 2 diabetes mellitus without complications: Secondary | ICD-10-CM | POA: Insufficient documentation

## 2016-06-23 DIAGNOSIS — K8020 Calculus of gallbladder without cholecystitis without obstruction: Secondary | ICD-10-CM | POA: Insufficient documentation

## 2016-06-23 DIAGNOSIS — I10 Essential (primary) hypertension: Secondary | ICD-10-CM | POA: Diagnosis not present

## 2016-06-23 DIAGNOSIS — Z01812 Encounter for preprocedural laboratory examination: Secondary | ICD-10-CM | POA: Insufficient documentation

## 2016-06-23 DIAGNOSIS — Z853 Personal history of malignant neoplasm of breast: Secondary | ICD-10-CM | POA: Insufficient documentation

## 2016-06-23 DIAGNOSIS — E669 Obesity, unspecified: Secondary | ICD-10-CM | POA: Insufficient documentation

## 2016-06-23 DIAGNOSIS — E785 Hyperlipidemia, unspecified: Secondary | ICD-10-CM | POA: Diagnosis not present

## 2016-06-23 HISTORY — DX: Hypothyroidism, unspecified: E03.9

## 2016-06-23 LAB — BASIC METABOLIC PANEL
ANION GAP: 8 (ref 5–15)
BUN: 16 mg/dL (ref 6–20)
CALCIUM: 9.8 mg/dL (ref 8.9–10.3)
CO2: 26 mmol/L (ref 22–32)
Chloride: 105 mmol/L (ref 101–111)
Creatinine, Ser: 0.57 mg/dL (ref 0.44–1.00)
Glucose, Bld: 129 mg/dL — ABNORMAL HIGH (ref 65–99)
POTASSIUM: 4.6 mmol/L (ref 3.5–5.1)
SODIUM: 139 mmol/L (ref 135–145)

## 2016-06-23 LAB — CBC
HEMATOCRIT: 40.7 % (ref 36.0–46.0)
HEMOGLOBIN: 13.4 g/dL (ref 12.0–15.0)
MCH: 28.9 pg (ref 26.0–34.0)
MCHC: 32.9 g/dL (ref 30.0–36.0)
MCV: 87.7 fL (ref 78.0–100.0)
Platelets: 225 10*3/uL (ref 150–400)
RBC: 4.64 MIL/uL (ref 3.87–5.11)
RDW: 13.4 % (ref 11.5–15.5)
WBC: 8.8 10*3/uL (ref 4.0–10.5)

## 2016-06-23 LAB — GLUCOSE, CAPILLARY: GLUCOSE-CAPILLARY: 137 mg/dL — AB (ref 65–99)

## 2016-06-23 NOTE — Progress Notes (Signed)
08/22/15 EKG 09/10/15 CXR 09/10/15 stress test  all in epic

## 2016-06-24 LAB — HEMOGLOBIN A1C
Hgb A1c MFr Bld: 6.6 % — ABNORMAL HIGH (ref 4.8–5.6)
Mean Plasma Glucose: 143 mg/dL

## 2016-06-28 ENCOUNTER — Ambulatory Visit (HOSPITAL_COMMUNITY): Payer: BLUE CROSS/BLUE SHIELD | Admitting: Certified Registered Nurse Anesthetist

## 2016-06-28 ENCOUNTER — Encounter (HOSPITAL_COMMUNITY)
Admission: RE | Disposition: A | Discharge status: Home / Self Care | Payer: Self-pay | Source: Ambulatory Visit | Attending: General Surgery

## 2016-06-28 ENCOUNTER — Encounter (HOSPITAL_COMMUNITY): Payer: Self-pay | Admitting: *Deleted

## 2016-06-28 ENCOUNTER — Ambulatory Visit (HOSPITAL_COMMUNITY)
Admission: RE | Admit: 2016-06-28 | Discharge: 2016-06-28 | Disposition: A | Discharge status: Home / Self Care | Payer: BLUE CROSS/BLUE SHIELD | Source: Ambulatory Visit | Attending: General Surgery | Admitting: General Surgery

## 2016-06-28 ENCOUNTER — Ambulatory Visit (HOSPITAL_COMMUNITY): Payer: BLUE CROSS/BLUE SHIELD

## 2016-06-28 DIAGNOSIS — Z79899 Other long term (current) drug therapy: Secondary | ICD-10-CM | POA: Diagnosis not present

## 2016-06-28 DIAGNOSIS — Z885 Allergy status to narcotic agent status: Secondary | ICD-10-CM | POA: Insufficient documentation

## 2016-06-28 DIAGNOSIS — K760 Fatty (change of) liver, not elsewhere classified: Secondary | ICD-10-CM | POA: Diagnosis not present

## 2016-06-28 DIAGNOSIS — K8010 Calculus of gallbladder with chronic cholecystitis without obstruction: Secondary | ICD-10-CM | POA: Diagnosis not present

## 2016-06-28 DIAGNOSIS — E119 Type 2 diabetes mellitus without complications: Secondary | ICD-10-CM | POA: Diagnosis not present

## 2016-06-28 DIAGNOSIS — I252 Old myocardial infarction: Secondary | ICD-10-CM | POA: Diagnosis not present

## 2016-06-28 DIAGNOSIS — Z886 Allergy status to analgesic agent status: Secondary | ICD-10-CM | POA: Insufficient documentation

## 2016-06-28 DIAGNOSIS — I10 Essential (primary) hypertension: Secondary | ICD-10-CM | POA: Diagnosis not present

## 2016-06-28 DIAGNOSIS — I2510 Atherosclerotic heart disease of native coronary artery without angina pectoris: Secondary | ICD-10-CM | POA: Diagnosis not present

## 2016-06-28 DIAGNOSIS — Z87891 Personal history of nicotine dependence: Secondary | ICD-10-CM | POA: Insufficient documentation

## 2016-06-28 DIAGNOSIS — Z419 Encounter for procedure for purposes other than remedying health state, unspecified: Secondary | ICD-10-CM

## 2016-06-28 DIAGNOSIS — Z7982 Long term (current) use of aspirin: Secondary | ICD-10-CM | POA: Insufficient documentation

## 2016-06-28 DIAGNOSIS — Z7984 Long term (current) use of oral hypoglycemic drugs: Secondary | ICD-10-CM | POA: Insufficient documentation

## 2016-06-28 DIAGNOSIS — K8020 Calculus of gallbladder without cholecystitis without obstruction: Secondary | ICD-10-CM | POA: Diagnosis not present

## 2016-06-28 DIAGNOSIS — Z888 Allergy status to other drugs, medicaments and biological substances status: Secondary | ICD-10-CM | POA: Diagnosis not present

## 2016-06-28 HISTORY — PX: CHOLECYSTECTOMY: SHX55

## 2016-06-28 LAB — GLUCOSE, CAPILLARY: GLUCOSE-CAPILLARY: 129 mg/dL — AB (ref 65–99)

## 2016-06-28 SURGERY — LAPAROSCOPIC CHOLECYSTECTOMY WITH INTRAOPERATIVE CHOLANGIOGRAM
Anesthesia: General | Site: Abdomen

## 2016-06-28 MED ORDER — SUGAMMADEX SODIUM 200 MG/2ML IV SOLN
INTRAVENOUS | Status: DC | PRN
  Administered 2016-06-28: 200 mg via INTRAVENOUS

## 2016-06-28 MED ORDER — LACTATED RINGERS IR SOLN
Status: DC | PRN
  Administered 2016-06-28: 1000 mL

## 2016-06-28 MED ORDER — ONDANSETRON HCL 4 MG/2ML IJ SOLN
INTRAMUSCULAR | Status: AC
  Filled 2016-06-28: qty 2

## 2016-06-28 MED ORDER — LIDOCAINE 2% (20 MG/ML) 5 ML SYRINGE
INTRAMUSCULAR | Status: DC | PRN
  Administered 2016-06-28: 80 mg via INTRAVENOUS

## 2016-06-28 MED ORDER — CEFAZOLIN SODIUM-DEXTROSE 2-4 GM/100ML-% IV SOLN
2.0000 g | INTRAVENOUS | Status: AC
  Administered 2016-06-28: 2 g via INTRAVENOUS
  Filled 2016-06-28: qty 100

## 2016-06-28 MED ORDER — ACETAMINOPHEN 325 MG PO TABS
650.0000 mg | ORAL_TABLET | ORAL | Status: DC | PRN
  Administered 2016-06-28: 650 mg via ORAL
  Filled 2016-06-28: qty 2

## 2016-06-28 MED ORDER — ROCURONIUM BROMIDE 50 MG/5ML IV SOSY
PREFILLED_SYRINGE | INTRAVENOUS | Status: AC
  Filled 2016-06-28: qty 5

## 2016-06-28 MED ORDER — PROPOFOL 10 MG/ML IV BOLUS
INTRAVENOUS | Status: DC | PRN
  Administered 2016-06-28: 120 mg via INTRAVENOUS

## 2016-06-28 MED ORDER — CEFAZOLIN SODIUM-DEXTROSE 2-4 GM/100ML-% IV SOLN
INTRAVENOUS | Status: AC
  Filled 2016-06-28: qty 100

## 2016-06-28 MED ORDER — GLYCOPYRROLATE 0.2 MG/ML IV SOSY
PREFILLED_SYRINGE | INTRAVENOUS | Status: AC
  Filled 2016-06-28: qty 5

## 2016-06-28 MED ORDER — FENTANYL CITRATE (PF) 250 MCG/5ML IJ SOLN
INTRAMUSCULAR | Status: AC
  Filled 2016-06-28: qty 5

## 2016-06-28 MED ORDER — BUPIVACAINE HCL (PF) 0.25 % IJ SOLN
INTRAMUSCULAR | Status: AC
  Filled 2016-06-28: qty 30

## 2016-06-28 MED ORDER — GLYCOPYRROLATE 0.2 MG/ML IJ SOLN
INTRAMUSCULAR | Status: DC | PRN
  Administered 2016-06-28: 0.4 mg via INTRAVENOUS

## 2016-06-28 MED ORDER — ONDANSETRON HCL 4 MG/2ML IJ SOLN
INTRAMUSCULAR | Status: DC | PRN
  Administered 2016-06-28 (×2): 4 mg via INTRAVENOUS

## 2016-06-28 MED ORDER — SCOPOLAMINE 1 MG/3DAYS TD PT72
1.0000 | MEDICATED_PATCH | TRANSDERMAL | Status: DC
  Administered 2016-06-28: 1 via TRANSDERMAL

## 2016-06-28 MED ORDER — DEXAMETHASONE SODIUM PHOSPHATE 10 MG/ML IJ SOLN
INTRAMUSCULAR | Status: DC | PRN
  Administered 2016-06-28: 10 mg via INTRAVENOUS

## 2016-06-28 MED ORDER — IBUPROFEN 200 MG PO TABS: 600.0000 mg | ORAL_TABLET | Freq: Four times a day (QID) | ORAL | 0 refills | Status: AC | PRN

## 2016-06-28 MED ORDER — IOPAMIDOL (ISOVUE-300) INJECTION 61%
INTRAVENOUS | Status: AC
  Filled 2016-06-28: qty 50

## 2016-06-28 MED ORDER — FENTANYL CITRATE (PF) 100 MCG/2ML IJ SOLN: 25.0000 ug | INTRAMUSCULAR | Status: DC | PRN

## 2016-06-28 MED ORDER — METOPROLOL TARTRATE 5 MG/5ML IV SOLN
INTRAVENOUS | Status: AC
  Filled 2016-06-28: qty 5

## 2016-06-28 MED ORDER — ACETAMINOPHEN 10 MG/ML IV SOLN
INTRAVENOUS | Status: DC | PRN
  Administered 2016-06-28: 1000 mg via INTRAVENOUS

## 2016-06-28 MED ORDER — FENTANYL CITRATE (PF) 100 MCG/2ML IJ SOLN
INTRAMUSCULAR | Status: DC | PRN
  Administered 2016-06-28 (×4): 50 ug via INTRAVENOUS

## 2016-06-28 MED ORDER — CHLORHEXIDINE GLUCONATE CLOTH 2 % EX PADS: 6.0000 | MEDICATED_PAD | Freq: Once | CUTANEOUS | Status: DC

## 2016-06-28 MED ORDER — PROPOFOL 10 MG/ML IV BOLUS
INTRAVENOUS | Status: AC
  Filled 2016-06-28: qty 20

## 2016-06-28 MED ORDER — LIP MEDEX EX OINT
TOPICAL_OINTMENT | CUTANEOUS | Status: AC
  Filled 2016-06-28: qty 7

## 2016-06-28 MED ORDER — MIDAZOLAM HCL 2 MG/2ML IJ SOLN
INTRAMUSCULAR | Status: AC
  Filled 2016-06-28: qty 2

## 2016-06-28 MED ORDER — MIDAZOLAM HCL 5 MG/5ML IJ SOLN
INTRAMUSCULAR | Status: DC | PRN
  Administered 2016-06-28: 2 mg via INTRAVENOUS

## 2016-06-28 MED ORDER — SCOPOLAMINE 1 MG/3DAYS TD PT72
MEDICATED_PATCH | TRANSDERMAL | Status: AC
  Filled 2016-06-28: qty 1

## 2016-06-28 MED ORDER — 0.9 % SODIUM CHLORIDE (POUR BTL) OPTIME
TOPICAL | Status: DC | PRN
  Administered 2016-06-28: 1000 mL

## 2016-06-28 MED ORDER — TRAMADOL HCL 50 MG PO TABS: 50.0000 mg | ORAL_TABLET | Freq: Four times a day (QID) | ORAL | 0 refills | Status: DC | PRN

## 2016-06-28 MED ORDER — ONDANSETRON HCL 4 MG/2ML IJ SOLN: 4.0000 mg | Freq: Once | INTRAMUSCULAR | Status: DC | PRN

## 2016-06-28 MED ORDER — ACETAMINOPHEN 10 MG/ML IV SOLN
INTRAVENOUS | Status: AC
  Filled 2016-06-28: qty 100

## 2016-06-28 MED ORDER — LIDOCAINE-EPINEPHRINE 1 %-1:100000 IJ SOLN
INTRAMUSCULAR | Status: AC
  Filled 2016-06-28: qty 1

## 2016-06-28 MED ORDER — DEXAMETHASONE SODIUM PHOSPHATE 10 MG/ML IJ SOLN
INTRAMUSCULAR | Status: AC
  Filled 2016-06-28: qty 1

## 2016-06-28 MED ORDER — LIDOCAINE-EPINEPHRINE 1 %-1:100000 IJ SOLN
INTRAMUSCULAR | Status: DC | PRN
  Administered 2016-06-28: 12.5 mL

## 2016-06-28 MED ORDER — LACTATED RINGERS IV SOLN
INTRAVENOUS | Status: DC
  Administered 2016-06-28 (×2): via INTRAVENOUS

## 2016-06-28 MED ORDER — IOPAMIDOL (ISOVUE-300) INJECTION 61%
INTRAVENOUS | Status: DC | PRN
  Administered 2016-06-28: 7 mL

## 2016-06-28 MED ORDER — LIDOCAINE 2% (20 MG/ML) 5 ML SYRINGE
INTRAMUSCULAR | Status: AC
  Filled 2016-06-28: qty 5

## 2016-06-28 MED ORDER — METOPROLOL TARTRATE 5 MG/5ML IV SOLN
INTRAVENOUS | Status: DC | PRN
  Administered 2016-06-28 (×2): 1 mg via INTRAVENOUS

## 2016-06-28 MED ORDER — BUPIVACAINE HCL (PF) 0.25 % IJ SOLN
INTRAMUSCULAR | Status: DC | PRN
  Administered 2016-06-28: 12.5 mL

## 2016-06-28 MED ORDER — ROCURONIUM BROMIDE 50 MG/5ML IV SOSY
PREFILLED_SYRINGE | INTRAVENOUS | Status: DC | PRN
  Administered 2016-06-28: 40 mg via INTRAVENOUS

## 2016-06-28 MED ORDER — ACETAMINOPHEN 650 MG RE SUPP: 650.0000 mg | RECTAL | Status: DC | PRN

## 2016-06-28 SURGICAL SUPPLY — 39 items
ADH SKN CLS APL DERMABOND .7 (GAUZE/BANDAGES/DRESSINGS) ×1
APPLIER CLIP ROT 10 11.4 M/L (STAPLE) ×2
APR CLP MED LRG 11.4X10 (STAPLE) ×1
BAG SPEC RTRVL LRG 6X4 10 (ENDOMECHANICALS) ×1
CHLORAPREP W/TINT 26ML (MISCELLANEOUS) ×2 IMPLANT
CLIP APPLIE ROT 10 11.4 M/L (STAPLE) ×1 IMPLANT
CLIP LIGATING HEMO O LOK GREEN (MISCELLANEOUS) IMPLANT
COVER MAYO STAND STRL (DRAPES) ×1 IMPLANT
COVER SURGICAL LIGHT HANDLE (MISCELLANEOUS) ×2 IMPLANT
DECANTER SPIKE VIAL GLASS SM (MISCELLANEOUS) ×2 IMPLANT
DERMABOND ADVANCED (GAUZE/BANDAGES/DRESSINGS) ×1
DERMABOND ADVANCED .7 DNX12 (GAUZE/BANDAGES/DRESSINGS) IMPLANT
DRAPE C-ARM 42X120 X-RAY (DRAPES) ×1 IMPLANT
ELECT L-HOOK LAP 45CM DISP (ELECTROSURGICAL)
ELECT PENCIL ROCKER SW 15FT (MISCELLANEOUS) ×2 IMPLANT
ELECT REM PT RETURN 9FT ADLT (ELECTROSURGICAL) ×2
ELECTRODE L-HOOK LAP 45CM DISP (ELECTROSURGICAL) IMPLANT
ELECTRODE REM PT RTRN 9FT ADLT (ELECTROSURGICAL) ×1 IMPLANT
GLOVE BIO SURGEON STRL SZ 6 (GLOVE) ×2 IMPLANT
GLOVE INDICATOR 6.5 STRL GRN (GLOVE) ×2 IMPLANT
GOWN STRL REUS W/TWL 2XL LVL3 (GOWN DISPOSABLE) ×2 IMPLANT
GOWN STRL REUS W/TWL XL LVL3 (GOWN DISPOSABLE) ×5 IMPLANT
HEMOSTAT SNOW SURGICEL 2X4 (HEMOSTASIS) IMPLANT
KIT BASIN OR (CUSTOM PROCEDURE TRAY) ×2 IMPLANT
L-HOOK LAP DISP 36CM (ELECTROSURGICAL) ×2
LHOOK LAP DISP 36CM (ELECTROSURGICAL) IMPLANT
POUCH SPECIMEN RETRIEVAL 10MM (ENDOMECHANICALS) ×2 IMPLANT
SCISSORS LAP 5X35 DISP (ENDOMECHANICALS) ×2 IMPLANT
SET CHOLANGIOGRAPH MIX (MISCELLANEOUS) ×1 IMPLANT
SET IRRIG TUBING LAPAROSCOPIC (IRRIGATION / IRRIGATOR) ×1 IMPLANT
SLEEVE XCEL OPT CAN 5 100 (ENDOMECHANICALS) ×2 IMPLANT
SUT MNCRL AB 4-0 PS2 18 (SUTURE) ×2 IMPLANT
TOWEL OR 17X26 10 PK STRL BLUE (TOWEL DISPOSABLE) ×2 IMPLANT
TOWEL OR NON WOVEN STRL DISP B (DISPOSABLE) ×2 IMPLANT
TRAY LAPAROSCOPIC (CUSTOM PROCEDURE TRAY) ×2 IMPLANT
TROCAR BLADELESS OPT 5 100 (ENDOMECHANICALS) ×2 IMPLANT
TROCAR XCEL BLUNT TIP 100MML (ENDOMECHANICALS) ×2 IMPLANT
TROCAR XCEL NON-BLD 11X100MML (ENDOMECHANICALS) ×2 IMPLANT
TUBING INSUF HEATED (TUBING) IMPLANT

## 2016-06-28 NOTE — H&P (Signed)
[name] 06/13/2016 4:07 PM Location: [facility] Patient #: [id number] DOB: [birth year] Married / Language: Cleophus Molt / Race: White Female   History of Present Illness [name]; 06/13/2016 5:58 PM) The patient is a 56 year old female who presents for a follow-up for Gall stones. Patient is a 56 year old female referred by Dr. Quintin Alto for consultation regarding gallstones last summer. She went to the emergency department because she felt like she is having a heart attack. She has had a previous heart attack so she was familiar with that sensation. That was ruled out and then a right upper quadrant ultrasound was performed which showed gallstones. Her blood sugars were very high and she had to be delayed. Of note, I operated on her husband taking a GI stromal tumor out of him. His name is Ahmina Zoll. Unfortunately in the intervening time, her husband has developed metastatic melanoma and is being treated at [facility] for this.  She continues to have episodes of pain. They are less frequent and have not required ED visits, but she has had to call out of work because they are too severe for her to be able to work.   US IMPRESSION: Cholelithiasis without signs of acute cholecystitis.  Hepatic steatosis.    Allergies Patsey Berthold, CMA; 06/13/2016 4:08 PM) Codeine Phosphate *ANALGESICS - OPIOID*  Nausea, Vomiting. Demerol *ANALGESICS - OPIOID*  Nausea, Vomiting. Morphine Sulfate (Concentrate) *ANALGESICS - OPIOID*  Nausea, Vomiting. HYDROmorphone HCl *ANALGESICS - OPIOID*  Nausea, Vomiting. Atorvastatin Calcium *ANTIHYPERLIPIDEMICS*  Dizziness, Nausea.  Medication History Patsey Berthold, CMA; 06/13/2016 4:09 PM) Aspirin EC (81MG  Tablet DR, Oral daily) Active. Synthroid (125MCG Tablet, Oral daily) Active. Lisinopril (10MG  Tablet, Oral daily) Active. MetFORMIN HCl (500MG  Tablet, Oral bid) Active. Nitroglycerin (0.4MG  Tab Sublingual, Sublingual prn)  Active. Crestor (5MG  Tablet, Oral daily) Active. Metoprolol Tartrate (25MG  Tablet, 1/2 tablet Oral bid) Active. Farxiga (10MG  Tablet, Oral) Active. Trulicity (1.5MG /0.5ML Soln Pen-inj, Subcutaneous) Active. Medications Reconciled    Review of Systems [name]; 06/13/2016 5:58 PM) All other systems negative  Vitals Patsey Berthold CMA; 06/13/2016 4:09 PM) 06/13/2016 4:09 PM Weight: 185.2 lb Height: 64in Body Surface Area: 1.89 m Body Mass Index: 31.79 kg/m  Temp.: 98.38F  Pulse: 76 (Regular)  BP: 112/60 (Sitting, Right Arm, Standard)       Physical Exam [name]; 06/13/2016 5:59 PM) General Mental Status-Alert. General Appearance-Consistent with stated age. Hydration-Well hydrated. Voice-Normal.  Chest and Lung Exam Chest and lung exam reveals -quiet, even and easy respiratory effort with no use of accessory muscles. Inspection Chest Wall - Normal. Back - normal.  Cardiovascular Cardiovascular examination reveals -normal pedal pulses bilaterally. Note: regular rate and rhythm  Abdomen Note: soft, non tender, non distended.     Assessment & Plan [name]; 06/13/2016 6:00 PM) SYMPTOMATIC CHOLELITHIASIS (K80.20) Impression: Patient continues to have classic symptoms of symptomatic cholelithiasis.  The surgical procedure was re-reviewed. Questions were answered. We will time this around her husband's chemo schedule. Current Plans Pt Education - Laparoscopic Cholecystectomy: gallbladder Schedule for Surgery   Signed by Teresa Klein, Hansen (06/13/2016 6:00 PM)

## 2016-06-28 NOTE — Op Note (Signed)
Laparoscopic Cholecystectomy with IOC Procedure Note  Indications: This patient presents with symptomatic cholelithiasis and will undergo laparoscopic cholecystectomy.  Pre-operative Diagnosis: symptomatic cholelithiasis  Post-operative Diagnosis: Same  Surgeon: Stark Klein   Assistants: Adonis Housekeeper, MD  Anesthesia: General endotracheal anesthesia and local  ASA Class: 3  Procedure Details  The patient was seen again in the Holding Room. The risks, benefits, complications, treatment options, and expected outcomes were discussed with the patient. The possibilities of  bleeding, recurrent infection, damage to nearby structures, the need for additional procedures, failure to diagnose a condition, the possible need to convert to an open procedure, and creating a complication requiring transfusion or operation were discussed with the patient. The likelihood of improving the patient's symptoms with return to their baseline status is good.    The patient and/or family concurred with the proposed plan, giving informed consent. The site of surgery properly noted. The patient was taken to Operating Room, and the procedure verified as Laparoscopic Cholecystectomy with Intraoperative Cholangiogram. A Time Out was held and the above information confirmed.  Prior to the induction of general anesthesia, antibiotic prophylaxis was administered. General endotracheal anesthesia was then administered and tolerated well. After the induction, the abdomen was prepped with Chloraprep and draped in the sterile fashion. The patient was positioned in the supine position.  Local anesthetic agent was injected into the skin near the umbilicus and a vertical incision made. We dissected down to the abdominal fascia with blunt dissection.  The fascia was incised vertically and we entered the peritoneal cavity bluntly.  A pursestring suture of 0-Vicryl was placed around the fascial opening.  The Hasson cannula was inserted  and secured with the stay suture.  Pneumoperitoneum was then created with CO2 and tolerated well without any adverse changes in the patient's vital signs. An 11-mm port was placed in the subxiphoid position.  Two 5-mm ports were placed in the right upper quadrant. All skin incisions were infiltrated with a local anesthetic agent before making the incision and placing the trocars.   We positioned the patient in reverse Trendelenburg, tilted slightly to the patient's left.  The gallbladder was identified, the fundus grasped and retracted cephalad. Adhesions were lysed bluntly and with the electrocautery where indicated, taking care not to injure any adjacent organs or viscus. The infundibulum was grasped and retracted laterally, exposing the peritoneum overlying the triangle of Calot. This was then divided and exposed in a blunt fashion. A critical view of the cystic duct and cystic artery was obtained.  The cystic duct was clearly identified and bluntly dissected circumferentially. The cystic duct was ligated with a clip distally.   The cystic artery anterior branch was clipped and divided.  An incision was made in the cystic duct and the [facility] cholangiogram catheter introduced. The catheter was secured using a clip. A cholangiogram was then performed, demonstrating normal anatomy of the right and left hepatic ducts, normal common bile duct, and emptying into the duodenum without evidence of filling defect.  The cystic duct was then ligated with clips and divided. The posterior branch of the cystic artery was identified, dissected free, ligated with clips and divided as well.   The gallbladder was dissected from the liver bed in retrograde fashion with the electrocautery. The gallbladder was removed and placed in an Endocatch bag.  The gallbladder and Endocatch bag were then removed through the umbilical port site.  The liver bed was irrigated and inspected. Hemostasis was achieved with the electrocautery.  Copious irrigation was utilized  and was repeatedly aspirated until clear.    We again inspected the right upper quadrant for hemostasis.  Pneumoperitoneum was released as we removed the trocars.   The pursestring suture was used to close the umbilical fascia.  4-0 Monocryl was used to close the skin.   The skin was cleaned and dry, and Dermabond was applied. The patient was then extubated and brought to the recovery room in stable condition. Instrument, sponge, and needle counts were correct at closure and at the conclusion of the case.   Findings: Distended gallbladder.    Estimated Blood Loss: min         Drains: none          Specimens: Gallbladder to pathology       Complications: None; patient tolerated the procedure well.         Disposition: PACU - hemodynamically stable.         Condition: stable

## 2016-06-28 NOTE — Transfer of Care (Signed)
Immediate Anesthesia Transfer of Care Note  Patient: [name]  Procedure(s) Performed: Procedure(s): LAPAROSCOPIC CHOLECYSTECTOMY WITH POSSIBLE INTRAOPERATIVE CHOLANGIOGRAM (N/A)  Patient Location: PACU  Anesthesia Type:General  Level of Consciousness:  sedated, patient cooperative and responds to stimulation  Airway & Oxygen Therapy:Patient Spontanous Breathing and Patient connected to face mask oxgen  Post-op Assessment:  Report given to PACU RN and Post -op Vital signs reviewed and stable  Post vital signs:  Reviewed and stable  Last Vitals:  Vitals:   06/28/16 0958  BP: 117/70  Pulse: 77  Resp: 16  Temp: [id number] C    Complications: No apparent anesthesia complications

## 2016-06-28 NOTE — Interval H&P Note (Signed)
History and Physical Interval Note:  06/28/2016 12:18 PM  [name]  has presented today for surgery, with the diagnosis of SYMPTOMATIC CHOLELITHIASIS  The various methods of treatment have been discussed with the patient and family. After consideration of risks, benefits and other options for treatment, the patient has consented to  Procedure(s): LAPAROSCOPIC CHOLECYSTECTOMY WITH POSSIBLE INTRAOPERATIVE CHOLANGIOGRAM (N/A) as a surgical intervention .  The patient's history has been reviewed, patient examined, no change in status, stable for surgery.  I have reviewed the patient's chart and labs.  Questions were answered to the patient's satisfaction.     Khayla Koppenhaver

## 2016-06-28 NOTE — Anesthesia Procedure Notes (Signed)
Procedure Name: Intubation Date/Time: 06/28/2016 12:34 PM Performed by: West Pugh Pre-anesthesia Checklist: Patient identified, Emergency Drugs available, Suction available, Patient being monitored and Timeout performed Patient Re-evaluated:Patient Re-evaluated prior to inductionOxygen Delivery Method: Circle system utilized Preoxygenation: Pre-oxygenation with 100% oxygen Intubation Type: IV induction Ventilation: Mask ventilation without difficulty Laryngoscope Size: Mac and 4 Grade View: Grade I Tube type: Oral Tube size: 7.5 mm Number of attempts: 1 Airway Equipment and Method: Stylet Placement Confirmation: ETT inserted through vocal cords under direct vision,  positive ETCO2,  CO2 detector and breath sounds checked- equal and bilateral Secured at: 20 cm Tube secured with: Tape Dental Injury: Teeth and Oropharynx as per pre-operative assessment

## 2016-06-28 NOTE — Anesthesia Postprocedure Evaluation (Signed)
Anesthesia Post Note  Patient: [name]  Procedure(s) Performed: Procedure(s) (LRB): LAPAROSCOPIC CHOLECYSTECTOMY WITH POSSIBLE INTRAOPERATIVE CHOLANGIOGRAM (N/A)  Patient location during evaluation: PACU Anesthesia Type: General Level of consciousness: awake and alert Pain management: pain level controlled Vital Signs Assessment: post-procedure vital signs reviewed and stable Respiratory status: spontaneous breathing, nonlabored ventilation, respiratory function stable and patient connected to nasal cannula oxygen Cardiovascular status: blood pressure returned to baseline and stable Postop Assessment: no signs of nausea or vomiting Anesthetic complications: no       Last Vitals:  Vitals:   06/28/16 1422 06/28/16 1434  BP: 131/77 135/74  Pulse: 90   Resp: 16 16  Temp:  36.4 C    Last Pain:  Vitals:   06/28/16 1443  TempSrc:   PainSc: 6                  Oneal Schoenberger DAVID

## 2016-06-28 NOTE — Discharge Instructions (Addendum)
Remove your scopalamine patch on 06/29/16 . Make sure not to get any ointment on your fingers. Wash skin with a paper towel and soap.   General Anesthesia, Adult, Care After These instructions provide you with information about caring for yourself after your procedure. Your health care provider may also give you more specific instructions. Your treatment has been planned according to current medical practices, but problems sometimes occur. Call your health care provider if you have any problems or questions after your procedure. What can I expect after the procedure? After the procedure, it is common to have:  Vomiting.  A sore throat.  Mental slowness. It is common to feel:  Nauseous.  Cold or shivery.  Sleepy.  Tired.  Sore or achy, even in parts of your body where you did not have surgery. Follow these instructions at home: For at least 24 hours after the procedure:  Do not:  Participate in activities where you could fall or become injured.  Drive.  Use heavy machinery.  Drink alcohol.  Take sleeping pills or medicines that cause drowsiness.  Make important decisions or sign legal documents.  Take care of children on your own.  Rest. Eating and drinking  If you vomit, drink water, juice, or soup when you can drink without vomiting.  Drink enough fluid to keep your urine clear or pale yellow.  Make sure you have little or no nausea before eating solid foods.  Follow the diet recommended by your health care provider. General instructions  Have a responsible adult stay with you until you are awake and alert.  Return to your normal activities as told by your health care provider. Ask your health care provider what activities are safe for you.  Take over-the-counter and prescription medicines only as told by your health care provider.  If you smoke, do not smoke without supervision.  Keep all follow-up visits as told by your health care provider. This is  important. Contact a health care provider if:  You continue to have nausea or vomiting at home, and medicines are not helpful.  You cannot drink fluids or start eating again.  You cannot urinate after 8-12 hours.  You develop a skin rash.  You have fever.  You have increasing redness at the site of your procedure. Get help right away if:  You have difficulty breathing.  You have chest pain.  You have unexpected bleeding.  You feel that you are having a life-threatening or urgent problem. This information is not intended to replace advice given to you by your health care provider. Make sure you discuss any questions you have with your health care provider. Document Released: 08/08/2000 Document Revised: 10/05/2015 Document Reviewed: 04/16/2015 Elsevier Interactive Patient Education  2017 [place] Office Phone Number [phone]   POST OP INSTRUCTIONS  Always review your discharge instruction sheet given to you by the facility where your surgery was performed.  IF YOU HAVE DISABILITY OR FAMILY LEAVE FORMS, YOU MUST BRING THEM TO THE OFFICE FOR PROCESSING.  DO NOT GIVE THEM TO YOUR DOCTOR.  1. A prescription for pain medication may be given to you upon discharge.  Take your pain medication as prescribed, if needed.  If narcotic pain medicine is not needed, then you may take acetaminophen (Tylenol) or ibuprofen (Advil) as needed. 2. Take your usually prescribed medications unless otherwise directed 3. If you need a refill on your pain medication, please contact your pharmacy.  They will contact our office  to request authorization.  Prescriptions will not be filled after 5pm or on week-ends. 4. You should eat very light the first 24 hours after surgery, such as soup, crackers, pudding, etc.  Resume your normal diet the day after surgery 5. It is common to experience some constipation if taking pain medication after surgery.  Increasing fluid  intake and taking a stool softener will usually help or prevent this problem from occurring.  A mild laxative (Milk of Magnesia or Miralax) should be taken according to package directions if there are no bowel movements after 48 hours. 6. You may shower in 48 hours.  The surgical glue will flake off in 2-3 weeks.   7. ACTIVITIES:  No strenuous activity or heavy lifting for 2 weeks.   a. You may drive when you no longer are taking prescription pain medication, you can comfortably wear a seatbelt, and you can safely maneuver your car and apply brakes. b. RETURN TO WORK:  __________1-2 weeks as long as no heavy lifting for 2 weeks._______________ Dennis Bast should see your doctor in the office for a follow-up appointment approximately three-four weeks after your surgery.    WHEN TO CALL YOUR DOCTOR: 1. Fever over 101.0 2. Nausea and/or vomiting. 3. Extreme swelling or bruising. 4. Continued bleeding from incision. 5. Increased pain, redness, or drainage from the incision.  The clinic staff is available to answer your questions during regular business hours.  Please dont hesitate to call and ask to speak to one of the nurses for clinical concerns.  If you have a medical emergency, go to the nearest emergency room or call 911.  A surgeon from [facility] is always on call at the hospital.  For further questions, please visit [email or web address]

## 2016-06-28 NOTE — Anesthesia Preprocedure Evaluation (Signed)
Anesthesia Evaluation  Patient identified by MRN, date of birth, ID band Patient awake    Reviewed: Allergy & Precautions, NPO status , Patient's Chart, lab work & pertinent test results  History of Anesthesia Complications (+) PONV  Airway Mallampati: I  TM Distance: >3 FB Neck ROM: Full    Dental   Pulmonary former smoker,    Pulmonary exam normal        Cardiovascular hypertension, + CAD and + Past MI  Normal cardiovascular exam     Neuro/Psych    GI/Hepatic   Endo/Other  diabetes  Renal/GU      Musculoskeletal   Abdominal   Peds  Hematology   Anesthesia Other Findings   Reproductive/Obstetrics                             Anesthesia Physical Anesthesia Plan  ASA: III  Anesthesia Plan: General   Post-op Pain Management:    Induction: Intravenous  Airway Management Planned: Oral ETT  Additional Equipment:   Intra-op Plan:   Post-operative Plan: Extubation in OR  Informed Consent: I have reviewed the patients History and Physical, chart, labs and discussed the procedure including the risks, benefits and alternatives for the proposed anesthesia with the patient or authorized representative who has indicated his/her understanding and acceptance.     Plan Discussed with: CRNA and Surgeon  Anesthesia Plan Comments:         Anesthesia Quick Evaluation

## 2016-06-29 ENCOUNTER — Encounter (HOSPITAL_COMMUNITY): Payer: Self-pay | Admitting: General Surgery

## 2016-07-11 DIAGNOSIS — E1165 Type 2 diabetes mellitus with hyperglycemia: Secondary | ICD-10-CM | POA: Diagnosis not present

## 2016-07-11 DIAGNOSIS — E782 Mixed hyperlipidemia: Secondary | ICD-10-CM | POA: Diagnosis not present

## 2016-07-11 DIAGNOSIS — E039 Hypothyroidism, unspecified: Secondary | ICD-10-CM | POA: Diagnosis not present

## 2016-07-11 DIAGNOSIS — I10 Essential (primary) hypertension: Secondary | ICD-10-CM | POA: Diagnosis not present

## 2016-07-13 DIAGNOSIS — F411 Generalized anxiety disorder: Secondary | ICD-10-CM | POA: Diagnosis not present

## 2016-07-13 DIAGNOSIS — F3341 Major depressive disorder, recurrent, in partial remission: Secondary | ICD-10-CM | POA: Diagnosis not present

## 2016-07-13 DIAGNOSIS — E1165 Type 2 diabetes mellitus with hyperglycemia: Secondary | ICD-10-CM | POA: Diagnosis not present

## 2016-07-13 DIAGNOSIS — E782 Mixed hyperlipidemia: Secondary | ICD-10-CM | POA: Diagnosis not present

## 2016-07-31 ENCOUNTER — Other Ambulatory Visit: Payer: Self-pay | Admitting: Cardiology

## 2016-08-03 ENCOUNTER — Telehealth: Payer: Self-pay | Admitting: Cardiology

## 2016-08-03 MED ORDER — METOPROLOL TARTRATE 25 MG PO TABS: 12.5000 mg | ORAL_TABLET | Freq: Two times a day (BID) | ORAL | 3 refills | Status: DC

## 2016-08-03 MED ORDER — ROSUVASTATIN CALCIUM 5 MG PO TABS: 5.0000 mg | ORAL_TABLET | Freq: Every day | ORAL | 3 refills | Status: DC

## 2016-08-03 NOTE — Telephone Encounter (Signed)
Refill complete 

## 2016-08-03 NOTE — Telephone Encounter (Signed)
1. Which medications need to be refilled? (please list name of each medication and dose if known)  rosuvastatin (CRESTOR) 5 MG tablet [[id number]]   metoprolol tartrate (LOPRESSOR) 25 MG tablet [[id number]]     2. Which pharmacy/location (including street and city if local pharmacy) is medication to be sent to? [facility]    3. Do they need a 30 day or 90 day supply?  30 day

## 2016-08-03 NOTE — Telephone Encounter (Signed)
° °  1. Which medications need to be refilled? (please list name of each medication and dose if known)  rosuvastatin (CRESTOR) 5 MG tablet [[id number]]   metoprolol tartrate (LOPRESSOR) 25 MG tablet [[id number]]     2. Which pharmacy/location (including street and city if local pharmacy) is medication to be sent to? [facility] [place]    3. Do they need a 30 day or 90 day supply?  30 day

## 2016-11-14 DIAGNOSIS — E782 Mixed hyperlipidemia: Secondary | ICD-10-CM | POA: Diagnosis not present

## 2016-11-14 DIAGNOSIS — E1165 Type 2 diabetes mellitus with hyperglycemia: Secondary | ICD-10-CM | POA: Diagnosis not present

## 2016-11-14 DIAGNOSIS — E039 Hypothyroidism, unspecified: Secondary | ICD-10-CM | POA: Diagnosis not present

## 2016-11-14 DIAGNOSIS — I10 Essential (primary) hypertension: Secondary | ICD-10-CM | POA: Diagnosis not present

## 2016-11-17 DIAGNOSIS — E1165 Type 2 diabetes mellitus with hyperglycemia: Secondary | ICD-10-CM | POA: Diagnosis not present

## 2016-11-17 DIAGNOSIS — F3341 Major depressive disorder, recurrent, in partial remission: Secondary | ICD-10-CM | POA: Diagnosis not present

## 2016-11-17 DIAGNOSIS — E782 Mixed hyperlipidemia: Secondary | ICD-10-CM | POA: Diagnosis not present

## 2016-11-17 DIAGNOSIS — F411 Generalized anxiety disorder: Secondary | ICD-10-CM | POA: Diagnosis not present

## 2016-11-26 DIAGNOSIS — Z853 Personal history of malignant neoplasm of breast: Secondary | ICD-10-CM | POA: Diagnosis not present

## 2016-11-26 DIAGNOSIS — Z1231 Encounter for screening mammogram for malignant neoplasm of breast: Secondary | ICD-10-CM | POA: Diagnosis not present

## 2016-12-07 ENCOUNTER — Encounter: Payer: Self-pay | Admitting: Cardiology

## 2016-12-07 ENCOUNTER — Ambulatory Visit (INDEPENDENT_AMBULATORY_CARE_PROVIDER_SITE_OTHER): Payer: BLUE CROSS/BLUE SHIELD | Admitting: Cardiology

## 2016-12-07 VITALS — BP 138/82 | HR 74 | Ht 63.0 in | Wt 196.0 lb

## 2016-12-07 DIAGNOSIS — I2510 Atherosclerotic heart disease of native coronary artery without angina pectoris: Secondary | ICD-10-CM | POA: Diagnosis not present

## 2016-12-07 DIAGNOSIS — I10 Essential (primary) hypertension: Secondary | ICD-10-CM

## 2016-12-07 DIAGNOSIS — E782 Mixed hyperlipidemia: Secondary | ICD-10-CM | POA: Diagnosis not present

## 2016-12-07 NOTE — Progress Notes (Signed)
Clinical Summary [name] is a 56 y.o.female seen today for follow up of the following medical problems.    1. CAD - prior DES to LAD 09/2012, LVEF 55% by LVgram at that time. - 09/2012 echo LVEF 55-60% - 09/2015 exercise nuclear stress test without ischemia, low risk Duke treadmill score of 5.5.  Chest pain symptoms a few months ago thought to be gallbladder related - s/p lab chole 06/2016  - episode about 2-3 months ago of chest pressure, thought possibly related to stress. Awoke from sleep. Took NG x1 with some relief. No exertional symptoms, no significant DOE/SOB.   2. Hyperlipidemia - nausea on atorva, previously changed to simva and later changed to crestor - tolerating crestor well  - reports recent labs with pcp  3. HTN - compliant with meds  4. DM2 - followed by pcp Dr Quintin Alto    [facility]: husband has metastatic melanoma, being treated at [facility].    Past Medical History:  Diagnosis Date  . Basal cell carcinoma 2013   Resected from left neck  . Breast carcinoma ([place])   . Complication of anesthesia   . Coronary artery disease    DES to LAD 2014  . Diabetes mellitus type 2 with complications, uncontrolled (HCC)    type 2  -- pills only  . Endometriosis    TAH/BSO in 2003  . Hyperlipidemia   . Hypertension   . Hypothyroidism   . Obesity   . PONV (postoperative nausea and vomiting)    severe--wants ? scop patch & drugs     Allergies  Allergen Reactions  . Codeine Nausea And Vomiting    "deathly sick"  . Demerol [Meperidine]     Pt states all pain meds make her sick on her stomach  . Dilaudid [Hydromorphone Hcl] Nausea And Vomiting  . Morphine And Related Nausea And Vomiting  . Atorvastatin Nausea And Vomiting     Current Outpatient Prescriptions  Medication Sig Dispense Refill  . acetaminophen (TYLENOL) 500 MG tablet Take 1,000 mg by mouth every 6 (six) hours as needed (for pain.).    [name] aspirin EC 81 MG tablet Take 81 mg by mouth  daily.    . citalopram (CELEXA) 40 MG tablet Take 40 mg by mouth daily.    . dapagliflozin propanediol (FARXIGA) 10 MG TABS tablet Take 10 mg by mouth daily.     . Dulaglutide 1.5 MG/0.5ML SOPN Inject 1.5 mg into the skin every Tuesday.     [name] ibuprofen (ADVIL,MOTRIN) 200 MG tablet Take 3 tablets (600 mg total) by mouth every 6 (six) hours as needed for mild pain, moderate pain or cramping (for pain.). 30 tablet 0  . levothyroxine (SYNTHROID, LEVOTHROID) 112 MCG tablet Take 112 mcg by mouth daily before breakfast.    . lisinopril (PRINIVIL,ZESTRIL) 10 MG tablet Take 10 mg by mouth daily.    . metFORMIN (GLUCOPHAGE) 500 MG tablet Take 250-1,000 mg by mouth 2 (two) times daily. (250 mg in the morning & 1000 mg in the evening)    . metoprolol tartrate (LOPRESSOR) 25 MG tablet Take 0.5 tablets (12.5 mg total) by mouth 2 (two) times daily. 90 tablet 3  . nitroGLYCERIN (NITROSTAT) 0.4 MG SL tablet Place 1 tablet (0.4 mg total) under the tongue every 5 (five) minutes as needed for chest pain. 75 tablet 0  . rosuvastatin (CRESTOR) 5 MG tablet Take 1 tablet (5 mg total) by mouth daily. 90 tablet 3  . traMADol (ULTRAM) 50 MG  tablet Take 1-2 tablets (50-100 mg total) by mouth every 6 (six) hours as needed for moderate pain or severe pain. 30 tablet 0   No current facility-administered medications for this visit.      Past Surgical History:  Procedure Laterality Date  . ABDOMINAL HYSTERECTOMY    . BREAST SURGERY    . CARDIAC CATHETERIZATION    . CHOLECYSTECTOMY N/A 06/28/2016   Procedure: LAPAROSCOPIC CHOLECYSTECTOMY WITH POSSIBLE INTRAOPERATIVE CHOLANGIOGRAM;  Surgeon: Stark Klein, MD;  Location: [facility] ORS;  Service: General;  Laterality: N/A;  . EYE SURGERY     cataract left eye  . LEFT HEART CATHETERIZATION WITH CORONARY ANGIOGRAM N/A 10/05/2012   Procedure: LEFT HEART CATHETERIZATION WITH CORONARY ANGIOGRAM;  Surgeon: Peter M Martinique, MD;  Location: [facility] CATH LAB;  Service: Cardiovascular;  Laterality:  N/A;  . MASTECTOMY     left  . PERCUTANEOUS CORONARY STENT INTERVENTION (PCI-S)  10/05/2012   Procedure: PERCUTANEOUS CORONARY STENT INTERVENTION (PCI-S);  Surgeon: Peter M Martinique, MD;  Location: [facility] CATH LAB;  Service: Cardiovascular;;  . SKIN CANCER EXCISION  2013   Basal cell-left neck  . TOTAL ABDOMINAL HYSTERECTOMY W/ BILATERAL SALPINGOOPHORECTOMY  2003     Allergies  Allergen Reactions  . Codeine Nausea And Vomiting    "deathly sick"  . Demerol [Meperidine]     Pt states all pain meds make her sick on her stomach  . Dilaudid [Hydromorphone Hcl] Nausea And Vomiting  . Morphine And Related Nausea And Vomiting  . Atorvastatin Nausea And Vomiting      No family history on file.   Social History Ms. Nauman reports that she quit smoking about 27 years ago. Her smoking use included Cigarettes. She started smoking about 41 years ago. She has a 15.00 pack-year smoking history. She has never used smokeless tobacco. Ms. Rosenau reports that she drinks alcohol.   Review of Systems CONSTITUTIONAL: No weight loss, fever, chills, weakness or fatigue.  HEENT: Eyes: No visual loss, blurred vision, double vision or yellow sclerae.No hearing loss, sneezing, congestion, runny nose or sore throat.  SKIN: No rash or itching.  CARDIOVASCULAR: per hpi RESPIRATORY: No shortness of breath, cough or sputum.  GASTROINTESTINAL: No anorexia, nausea, vomiting or diarrhea. No abdominal pain or blood.  GENITOURINARY: No burning on urination, no polyuria NEUROLOGICAL: No headache, dizziness, syncope, paralysis, ataxia, numbness or tingling in the extremities. No change in bowel or bladder control.  MUSCULOSKELETAL: No muscle, back pain, joint pain or stiffness.  LYMPHATICS: No enlarged nodes. No history of splenectomy.  PSYCHIATRIC: No history of depression or anxiety.  ENDOCRINOLOGIC: No reports of sweating, cold or heat intolerance. No polyuria or polydipsia.  [name]   Physical Examination Vitals:     12/07/16 0920  BP: 138/82  Pulse: 74   Vitals:   12/07/16 0920  Weight: 196 lb (88.9 kg)  Height: 5\' 3"  (1.6 m)    Gen: resting comfortably, no acute distress HEENT: no scleral icterus, pupils equal round and reactive, no palptable cervical adenopathy,  CV: RRR, no m/r/g, no jvd Resp: Clear to auscultation bilaterally GI: abdomen is soft, non-tender, non-distended, normal bowel sounds, no hepatosplenomegaly MSK: extremities are warm, no edema.  Skin: warm, no rash Neuro:  no focal deficits Psych: appropriate affect   Diagnostic Studies 09/2012 Cath PROCEDURAL FINDINGS  Hemodynamics:  AO 129/82 mean 104 mm Hg  LV 132/22 mm Hg  Coronary angiography:  Coronary dominance: right  Left mainstem: Normal  Left anterior descending (LAD): 90% mid LAD with moderate calcification.  The distal LAD has diffuse irregularities.  Left circumflex (LCx): less than 10% luminal irregularity  Right coronary artery (RCA): Mild proximal irregularity less than 20%.  Left ventriculography: Left ventricular systolic function is normal, LVEF is estimated at 55%, there is mild mid to distal anterior wall hypokinesis, there is no significant mitral regurgitation  PCI Note: Following the diagnostic procedure, the decision was made to proceed with PCI. Weight-based bivalirudin was given for anticoagulation. Effient 60 mg was given orally. Once a therapeutic ACT was achieved, a 6 [place] XBLAD 3.5 guide catheter was inserted. A prowater coronary guidewire was used to cross the lesion. The lesion was predilated with a 2.5 mm compliant balloon. The lesion was then stented with a 3.5 x 28 mm Promus premier stent. The stent was postdilated with a 3.75 mm noncompliant balloon. Following PCI, there was 0% residual stenosis and TIMI-3 flow. Final angiography confirmed an excellent result. The patient tolerated the procedure well. There were no immediate procedural complications. A TR band was used for radial hemostasis.  The patient was transferred to the post catheterization recovery area for further monitoring.  PCI Data:  Vessel - LAD/Segment - mid  Percent Stenosis (pre) 90%  TIMI-flow 3  Stent 3.5 x 28 mm Promus premier  Percent Stenosis (post) 0%  TIMI-flow (post) 3   Final Conclusions:  1. Single vessel obstructive CAD with high grade mid LAD stenosis.  2. Well preserved LV systolic function with anterior hypokinesis.  3. Successful stenting of the mid LAD with DES.  Recommendations:  Continue dual antiplatelet therapy for one year. Risk factor modification.   09/2012 Echo Study Conclusions  - Left ventricle: Wall thickness was increased in a pattern of mild LVH with disproportionate septal thickening. Systolic function was normal. The estimated ejection fraction was in the range of 55% to 60%. Wall motion was normal; there were no regional wall motion abnormalities. - Aortic valve: Mildly calcified annulus. Trileaflet; mildly thickened leaflets. - Left atrium: The atrium was mildly dilated. - Atrial septum: No defect or patent foramen ovale was identified. - Pulmonary arteries: PA peak pressure: 41mm Hg (S).  02/13/14 Clinic EKG NSR  09/2015 Nuclear stress test  No diagnostic ST segment changes to indicate ischemia. No arrhythmias. Low risk Duke treadmill score of 5.5.  No significant myocardial perfusion defects to indicate scar or ischemia.  This is a low risk study.  Nuclear stress EF: 70%.    Assessment and Plan  1. CAD - isolated episode of chest pain, no overally consistent with cardiac etiology - continue to monitor at this time, conitnue current meds  2. HTN - bp is at goal, continue current meds  3. DM type II - per Dr Quintin Alto  4. Hyperlipidemia - request labs from pcp - continue statin      Arnoldo Lenis, M.D.

## 2016-12-07 NOTE — Patient Instructions (Signed)
Medication Instructions:  Your physician recommends that you continue on your current medications as directed. Please refer to the Current Medication list given to you today.   Labwork: I WILL REQUEST LABS FROM PCP   Testing/Procedures: NONE  Follow-Up: Your physician wants you to follow-up in: [place] DR. BRANCH .  You will receive a reminder letter in the mail two months in advance. If you don't receive a letter, please call our office to schedule the follow-up appointment.   Any Other Special Instructions Will Be Listed Below (If Applicable).     If you need a refill on your cardiac medications before your next appointment, please call your pharmacy.

## 2017-01-31 DIAGNOSIS — T63443D Toxic effect of venom of bees, assault, subsequent encounter: Secondary | ICD-10-CM | POA: Diagnosis not present

## 2017-01-31 DIAGNOSIS — Z6835 Body mass index (BMI) 35.0-35.9, adult: Secondary | ICD-10-CM | POA: Diagnosis not present

## 2017-02-09 DIAGNOSIS — F4322 Adjustment disorder with anxiety: Secondary | ICD-10-CM | POA: Diagnosis not present

## 2017-02-09 DIAGNOSIS — F3342 Major depressive disorder, recurrent, in full remission: Secondary | ICD-10-CM | POA: Diagnosis not present

## 2017-03-30 IMAGING — DX DG CHEST 2V
2 series · 2 of 2 positions shown · non-contrast
Comparison: 10/03/2012

CLINICAL DATA: Pt states she has been having chest pain onset since
2 am. Pt also c/o of chest pain and N/V. Pt states she does have a
stent in and states she has a hx of breast cancer.

EXAM:
CHEST  2 VIEW

[chest pa]
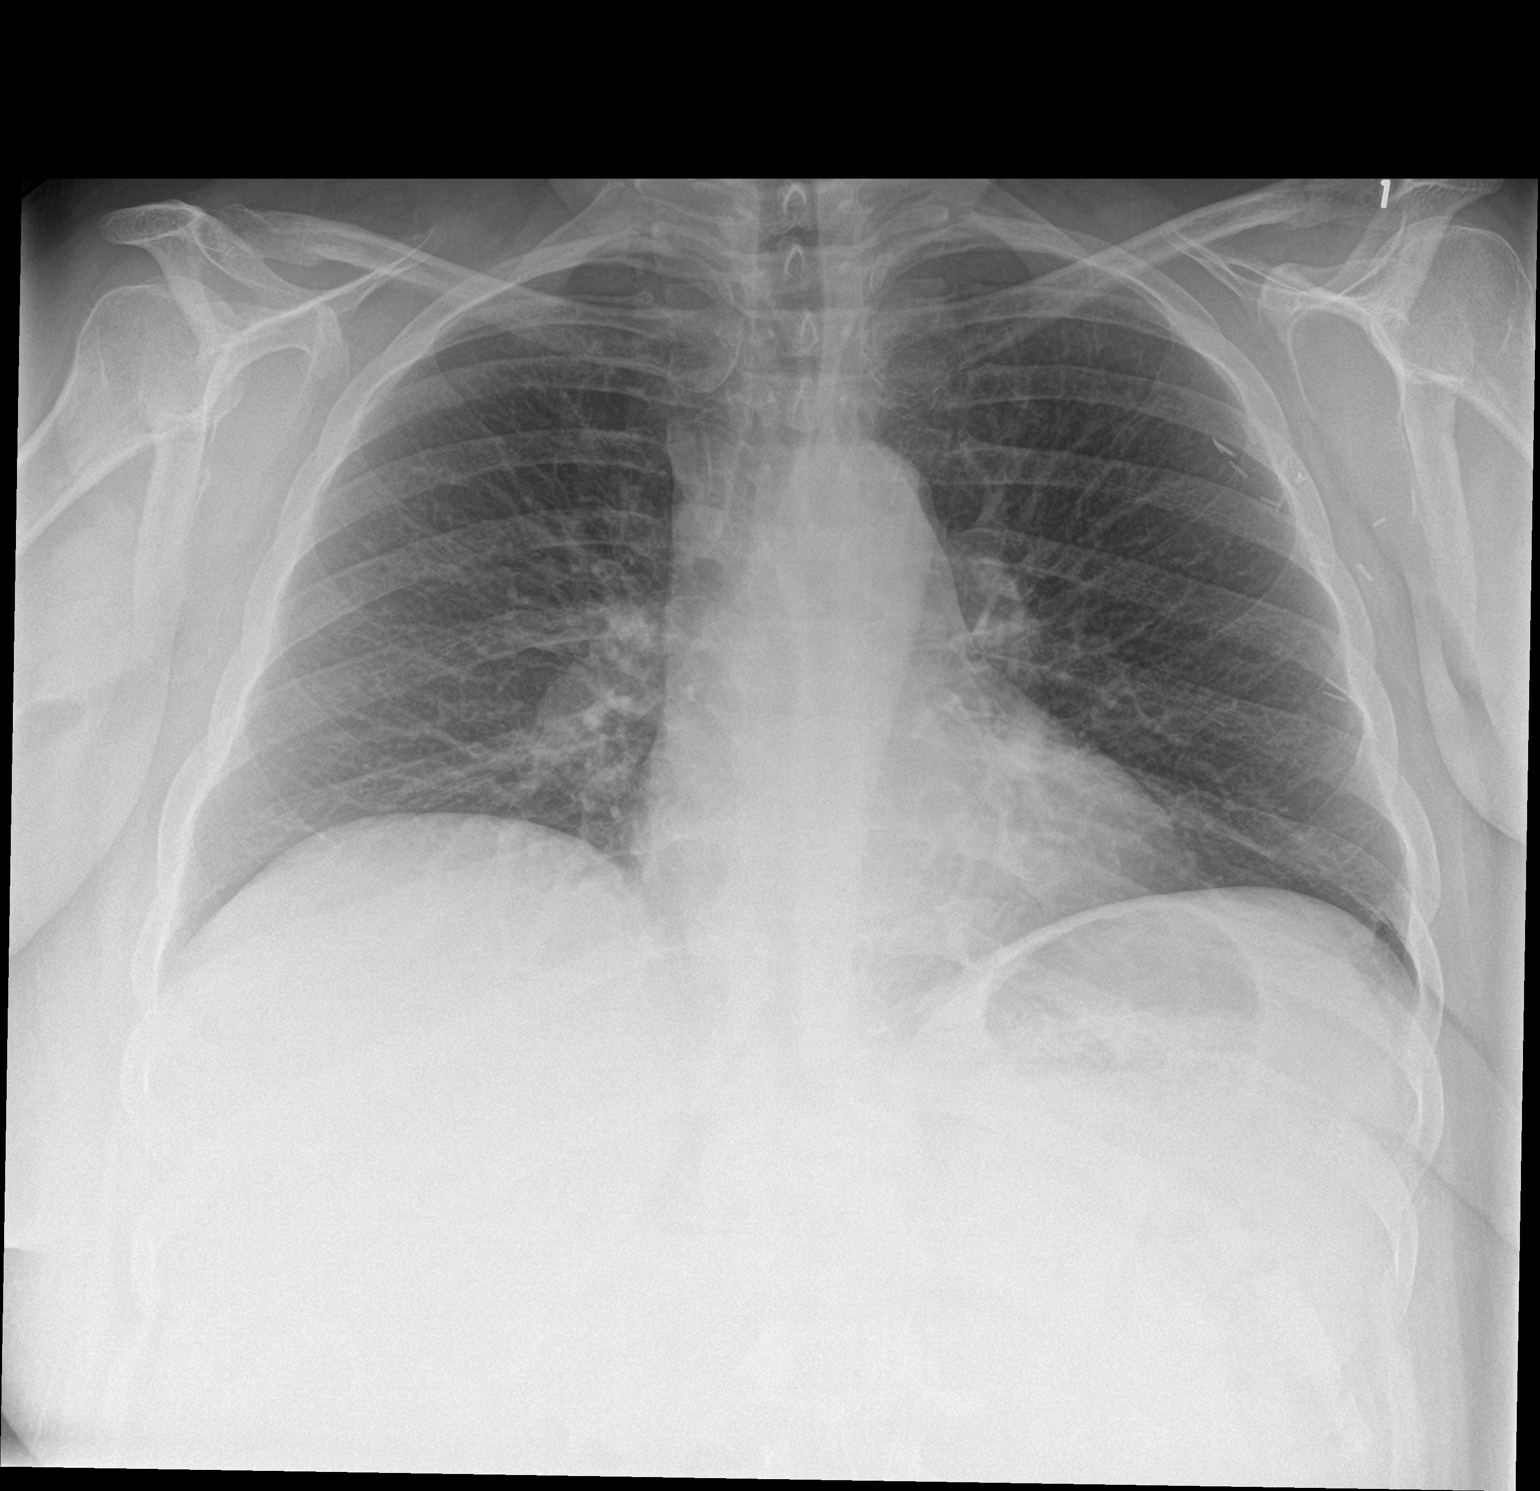

[chest lat]
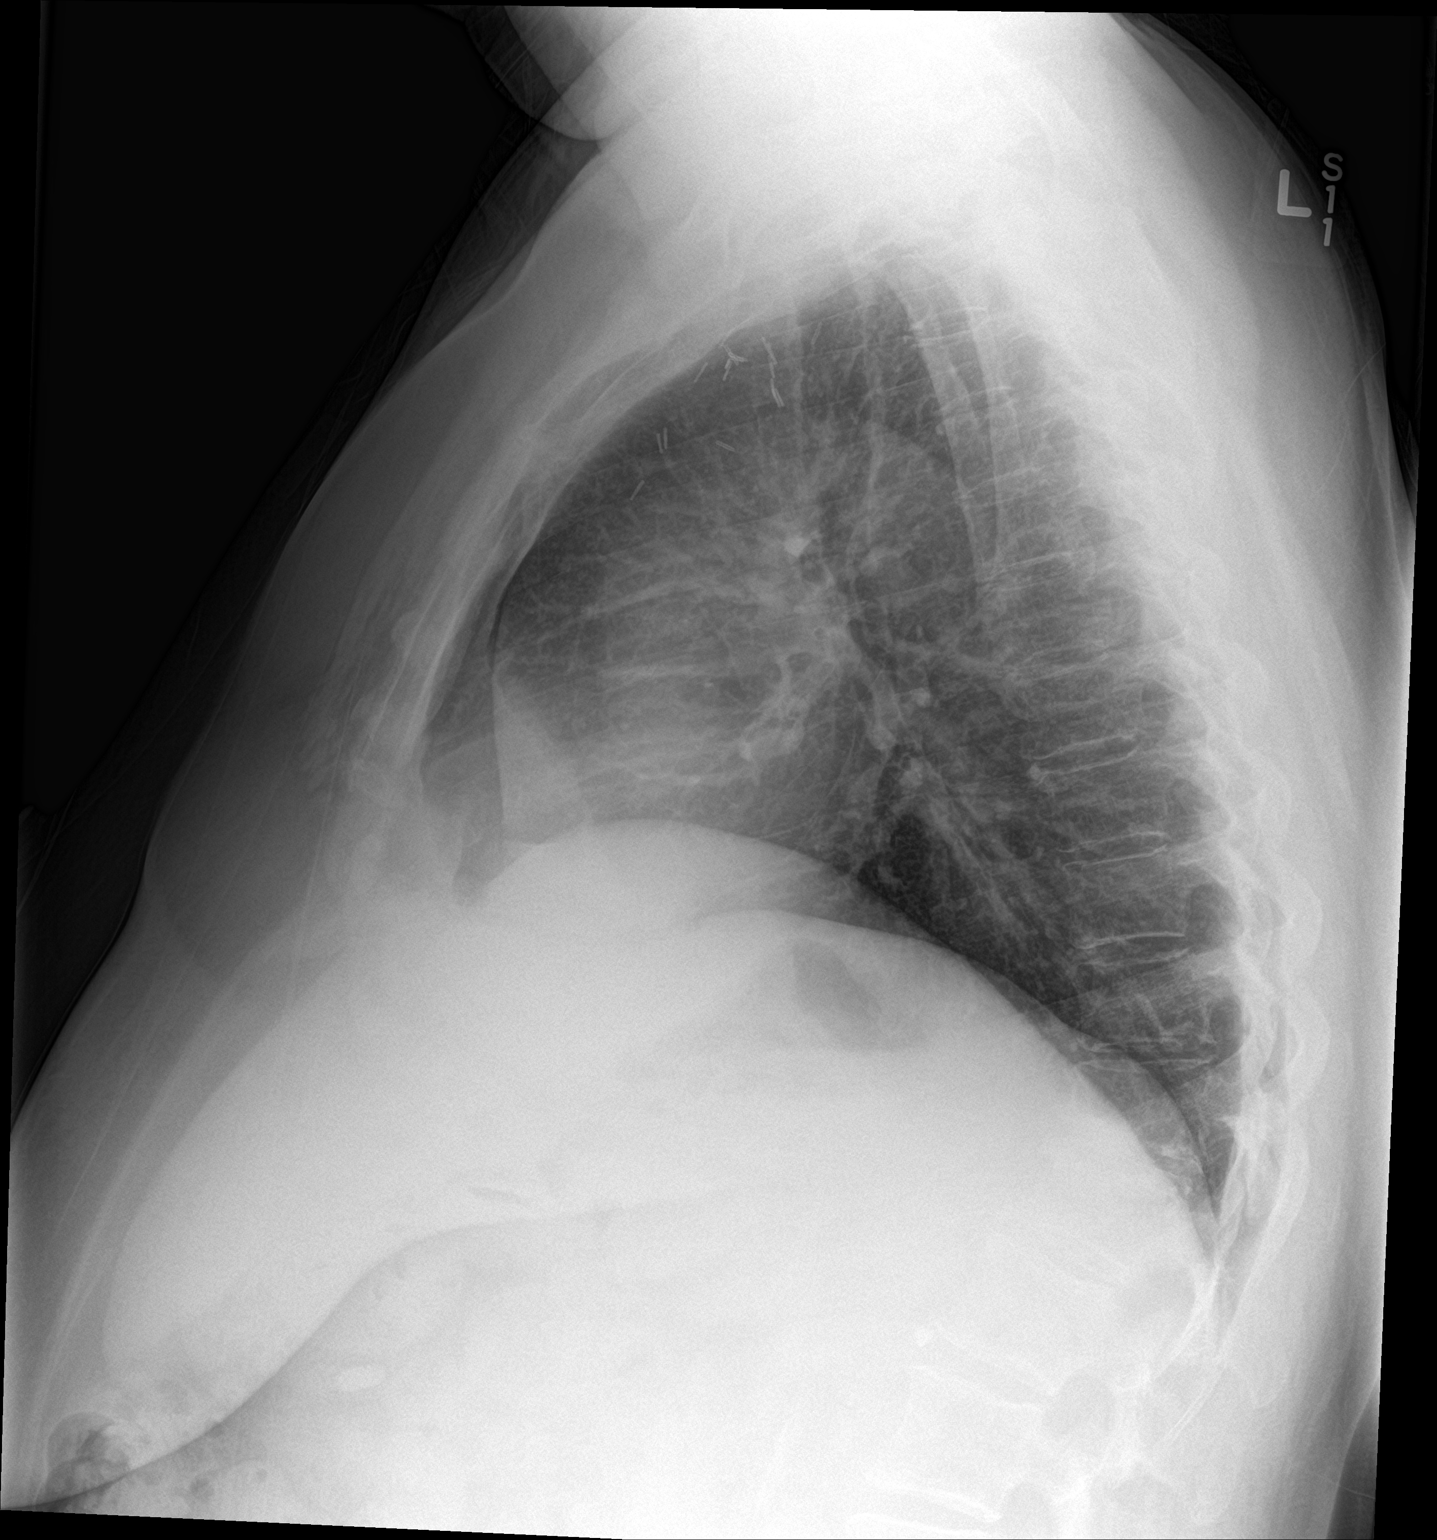

[2 of 2 positions shown; findings below may reference images not displayed]

FINDINGS: The heart size and mediastinal contours are within normal limits.
Both lungs are clear.

A no pleural effusion or pneumothorax

Status post left breast surgery, stable.

Bony thorax is demineralized with mild anterior wedging of the T12
vertebrae. This is stable.
IMPRESSION: No active cardiopulmonary disease.

## 2017-04-04 IMAGING — NM NM MYOCAR MULTI W/SPECT W/WALL MOTION & EF
2 series · 12 of 12 positions shown · non-contrast
Comparison: none

[Series 1: rest · 8.28mm/px · 6 of 64 frames shown]
[frame 6/64]
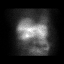
[frame 16/64]
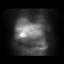
[frame 27/64]
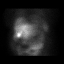
[frame 38/64]
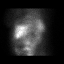
[frame 48/64]
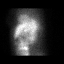
[frame 59/64]
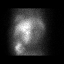

[Series 2: stress gated · 8.28mm/px · 6 of 64 frames shown]
[frame 6/64]
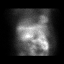
[frame 16/64]
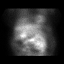
[frame 27/64]
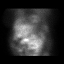
[frame 38/64]
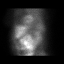
[frame 48/64]
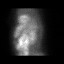
[frame 59/64]
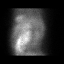

[12 of 12 positions shown; findings below may reference images not displayed]

Canned report from images found in remote index.

Refer to host system for actual result text.

## 2017-05-12 DIAGNOSIS — R1031 Right lower quadrant pain: Secondary | ICD-10-CM | POA: Diagnosis not present

## 2017-05-12 DIAGNOSIS — E1165 Type 2 diabetes mellitus with hyperglycemia: Secondary | ICD-10-CM | POA: Diagnosis not present

## 2017-05-12 DIAGNOSIS — E039 Hypothyroidism, unspecified: Secondary | ICD-10-CM | POA: Diagnosis not present

## 2017-05-12 DIAGNOSIS — E782 Mixed hyperlipidemia: Secondary | ICD-10-CM | POA: Diagnosis not present

## 2017-05-25 DIAGNOSIS — E782 Mixed hyperlipidemia: Secondary | ICD-10-CM | POA: Diagnosis not present

## 2017-05-25 DIAGNOSIS — F411 Generalized anxiety disorder: Secondary | ICD-10-CM | POA: Diagnosis not present

## 2017-05-25 DIAGNOSIS — E1165 Type 2 diabetes mellitus with hyperglycemia: Secondary | ICD-10-CM | POA: Diagnosis not present

## 2017-05-25 DIAGNOSIS — I10 Essential (primary) hypertension: Secondary | ICD-10-CM | POA: Diagnosis not present

## 2017-05-25 DIAGNOSIS — Z1389 Encounter for screening for other disorder: Secondary | ICD-10-CM | POA: Diagnosis not present

## 2017-05-30 DIAGNOSIS — R110 Nausea: Secondary | ICD-10-CM | POA: Diagnosis not present

## 2017-05-30 DIAGNOSIS — R1031 Right lower quadrant pain: Secondary | ICD-10-CM | POA: Diagnosis not present

## 2017-05-30 DIAGNOSIS — I700 Atherosclerosis of aorta: Secondary | ICD-10-CM | POA: Diagnosis not present

## 2017-07-24 ENCOUNTER — Other Ambulatory Visit: Payer: Self-pay

## 2017-07-24 MED ORDER — METOPROLOL TARTRATE 25 MG PO TABS: 12.5000 mg | ORAL_TABLET | Freq: Two times a day (BID) | ORAL | 0 refills | Status: DC

## 2017-07-24 MED ORDER — ROSUVASTATIN CALCIUM 5 MG PO TABS: 5.0000 mg | ORAL_TABLET | Freq: Every day | ORAL | 0 refills | Status: DC

## 2017-07-24 NOTE — Telephone Encounter (Signed)
Refilled meds as fax requested

## 2017-10-20 ENCOUNTER — Other Ambulatory Visit: Payer: Self-pay

## 2017-10-20 MED ORDER — METOPROLOL TARTRATE 25 MG PO TABS: 12.5000 mg | ORAL_TABLET | Freq: Two times a day (BID) | ORAL | 1 refills | Status: DC

## 2017-10-20 MED ORDER — ROSUVASTATIN CALCIUM 5 MG PO TABS: 5.0000 mg | ORAL_TABLET | Freq: Every day | ORAL | 1 refills | Status: DC

## 2017-10-20 NOTE — Telephone Encounter (Signed)
Per fax request  Refill crestor and metoprolol

## 2017-11-20 DIAGNOSIS — Z853 Personal history of malignant neoplasm of breast: Secondary | ICD-10-CM | POA: Diagnosis not present

## 2017-11-20 DIAGNOSIS — E039 Hypothyroidism, unspecified: Secondary | ICD-10-CM | POA: Diagnosis not present

## 2017-11-20 DIAGNOSIS — E1165 Type 2 diabetes mellitus with hyperglycemia: Secondary | ICD-10-CM | POA: Diagnosis not present

## 2017-11-20 DIAGNOSIS — I10 Essential (primary) hypertension: Secondary | ICD-10-CM | POA: Diagnosis not present

## 2017-11-20 DIAGNOSIS — F3341 Major depressive disorder, recurrent, in partial remission: Secondary | ICD-10-CM | POA: Diagnosis not present

## 2017-11-20 DIAGNOSIS — E782 Mixed hyperlipidemia: Secondary | ICD-10-CM | POA: Diagnosis not present

## 2017-11-23 DIAGNOSIS — E1165 Type 2 diabetes mellitus with hyperglycemia: Secondary | ICD-10-CM | POA: Diagnosis not present

## 2017-11-23 DIAGNOSIS — E782 Mixed hyperlipidemia: Secondary | ICD-10-CM | POA: Diagnosis not present

## 2017-11-23 DIAGNOSIS — I10 Essential (primary) hypertension: Secondary | ICD-10-CM | POA: Diagnosis not present

## 2017-12-07 ENCOUNTER — Ambulatory Visit: Payer: BLUE CROSS/BLUE SHIELD | Admitting: Cardiology

## 2017-12-07 ENCOUNTER — Other Ambulatory Visit: Payer: Self-pay

## 2017-12-07 ENCOUNTER — Encounter: Payer: Self-pay | Admitting: Cardiology

## 2017-12-07 VITALS — BP 120/76 | HR 70 | Ht 64.0 in | Wt 204.0 lb

## 2017-12-07 DIAGNOSIS — I10 Essential (primary) hypertension: Secondary | ICD-10-CM

## 2017-12-07 DIAGNOSIS — E119 Type 2 diabetes mellitus without complications: Secondary | ICD-10-CM | POA: Diagnosis not present

## 2017-12-07 DIAGNOSIS — E782 Mixed hyperlipidemia: Secondary | ICD-10-CM | POA: Diagnosis not present

## 2017-12-07 DIAGNOSIS — Z1231 Encounter for screening mammogram for malignant neoplasm of breast: Secondary | ICD-10-CM | POA: Diagnosis not present

## 2017-12-07 DIAGNOSIS — I2510 Atherosclerotic heart disease of native coronary artery without angina pectoris: Secondary | ICD-10-CM | POA: Diagnosis not present

## 2017-12-07 DIAGNOSIS — Z853 Personal history of malignant neoplasm of breast: Secondary | ICD-10-CM | POA: Diagnosis not present

## 2017-12-07 NOTE — Progress Notes (Signed)
Clinical Summary Ms. Friedel is a 57 y.o.female seen today for follow up of the following medical problems.    1. CAD - prior DES to LAD 09/2012, LVEF 55% by LVgram at that time. - 09/2012 echo LVEF 55-60% - 09/2015 exercise nuclear stress test without ischemia, low risk Duke treadmill score of 5.5.  Chest pain symptoms a few months ago thought to be gallbladder related - s/p lab chole 06/2016   - no recent chest pain. Can have some sharp pain between shoulder blades with postioning - no SOB/DOE.  - compliant with meds    2. Hyperlipidemia - nausea on atorva, previously changed to simva and later changed to crestor - tolerating crestor well  - reports recent labs with pcp  3. HTN - she is compliant with meds  4. DM2 - followed by pcp Dr Quintin Alto    SH: husband has metastatic melanoma, being treated at [facility].  Works [employer]   Past Medical History:  Diagnosis Date  . Basal cell carcinoma 2013   Resected from left neck  . Breast carcinoma ([place])   . Complication of anesthesia   . Coronary artery disease    DES to LAD 2014  . Diabetes mellitus type 2 with complications, uncontrolled (HCC)    type 2  -- pills only  . Endometriosis    TAH/BSO in 2003  . Hyperlipidemia   . Hypertension   . Hypothyroidism   . Obesity   . PONV (postoperative nausea and vomiting)    severe--wants ? scop patch & drugs     Allergies  Allergen Reactions  . Codeine Nausea And Vomiting    "deathly sick"  . Demerol [Meperidine]     Pt states all pain meds make her sick on her stomach  . Dilaudid [Hydromorphone Hcl] Nausea And Vomiting  . Morphine And Related Nausea And Vomiting  . Atorvastatin Nausea And Vomiting     Current Outpatient Medications  Medication Sig Dispense Refill  . acetaminophen (TYLENOL) 500 MG tablet Take 1,000 mg by mouth every 6 (six) hours as needed (for pain.).    [name] aspirin EC 81 MG tablet Take 81 mg by mouth daily.    . citalopram  (CELEXA) 40 MG tablet Take 40 mg by mouth daily.    [name] ibuprofen (ADVIL,MOTRIN) 200 MG tablet Take 3 tablets (600 mg total) by mouth every 6 (six) hours as needed for mild pain, moderate pain or cramping (for pain.). 30 tablet 0  . JARDIANCE 25 MG TABS tablet TK 1 T PO D  6  . levothyroxine (SYNTHROID, LEVOTHROID) 100 MCG tablet TK 1 T PO D  12  . lisinopril (PRINIVIL,ZESTRIL) 10 MG tablet Take 10 mg by mouth daily.    . metFORMIN (GLUCOPHAGE) 500 MG tablet Take 250-1,000 mg by mouth 2 (two) times daily. (250 mg in the morning & 1000 mg in the evening)    . metoprolol tartrate (LOPRESSOR) 25 MG tablet Take 0.5 tablets (12.5 mg total) by mouth 2 (two) times daily. 90 tablet 1  . nitroGLYCERIN (NITROSTAT) 0.4 MG SL tablet Place 1 tablet (0.4 mg total) under the tongue every 5 (five) minutes as needed for chest pain. 75 tablet 0  . rosuvastatin (CRESTOR) 5 MG tablet Take 1 tablet (5 mg total) by mouth daily. 90 tablet 1  . VICTOZA 18 MG/3ML SOPN   5   No current facility-administered medications for this visit.      Past Surgical History:  Procedure Laterality  Date  . ABDOMINAL HYSTERECTOMY    . BREAST SURGERY    . CARDIAC CATHETERIZATION    . CHOLECYSTECTOMY N/A 06/28/2016   Procedure: LAPAROSCOPIC CHOLECYSTECTOMY WITH POSSIBLE INTRAOPERATIVE CHOLANGIOGRAM;  Surgeon: Stark Klein, MD;  Location: [facility];  Service: General;  Laterality: N/A;  . EYE SURGERY     cataract left eye  . LEFT HEART CATHETERIZATION WITH CORONARY ANGIOGRAM N/A 10/05/2012   Procedure: LEFT HEART CATHETERIZATION WITH CORONARY ANGIOGRAM;  Surgeon: Peter M Martinique, MD;  Location: [facility] CATH LAB;  Service: Cardiovascular;  Laterality: N/A;  . MASTECTOMY     left  . PERCUTANEOUS CORONARY STENT INTERVENTION (PCI-S)  10/05/2012   Procedure: PERCUTANEOUS CORONARY STENT INTERVENTION (PCI-S);  Surgeon: Peter M Martinique, MD;  Location: [facility] CATH LAB;  Service: Cardiovascular;;  . SKIN CANCER EXCISION  2013   Basal cell-left neck  . TOTAL  ABDOMINAL HYSTERECTOMY W/ BILATERAL SALPINGOOPHORECTOMY  2003     Allergies  Allergen Reactions  . Codeine Nausea And Vomiting    "deathly sick"  . Demerol [Meperidine]     Pt states all pain meds make her sick on her stomach  . Dilaudid [Hydromorphone Hcl] Nausea And Vomiting  . Morphine And Related Nausea And Vomiting  . Atorvastatin Nausea And Vomiting      Family History  Problem Relation Age of Onset  . Hypertension Mother   . Heart disease Father      Social History Ms. Fifer reports that she quit smoking about 28 years ago. Her smoking use included cigarettes. She started smoking about 42 years ago. She has a 15.00 pack-year smoking history. She has never used smokeless tobacco. Ms. Marteney reports that she drinks alcohol.   Review of Systems CONSTITUTIONAL: No weight loss, fever, chills, weakness or fatigue.  HEENT: Eyes: No visual loss, blurred vision, double vision or yellow sclerae.No hearing loss, sneezing, congestion, runny nose or sore throat.  SKIN: No rash or itching.  CARDIOVASCULAR: per hpi RESPIRATORY: per hpi  GASTROINTESTINAL: No anorexia, nausea, vomiting or diarrhea. No abdominal pain or blood.  GENITOURINARY: No burning on urination, no polyuria NEUROLOGICAL: No headache, dizziness, syncope, paralysis, ataxia, numbness or tingling in the extremities. No change in bowel or bladder control.  MUSCULOSKELETAL: No muscle, back pain, joint pain or stiffness.  LYMPHATICS: No enlarged nodes. No history of splenectomy.  PSYCHIATRIC: No history of depression or anxiety.  ENDOCRINOLOGIC: No reports of sweating, cold or heat intolerance. No polyuria or polydipsia.  [name]   Physical Examination Vitals:   12/07/17 1020  BP: 120/76  Pulse: 70  SpO2: 98%   Vitals:   12/07/17 1020  Weight: 204 lb (92.5 kg)  Height: 5\' 4"  (1.626 m)    Gen: resting comfortably, no acute distress HEENT: no scleral icterus, pupils equal round and reactive, no palptable  cervical adenopathy,  CV:RRR, no mrg, no jvd Resp: Clear to auscultation bilaterally GI: abdomen is soft, non-tender, non-distended, normal bowel sounds, no hepatosplenomegaly MSK: extremities are warm, no edema.  Skin: warm, no rash Neuro:  no focal deficits Psych: appropriate affect   Diagnostic Studies 09/2012 Cath PROCEDURAL FINDINGS  Hemodynamics:  AO 129/82 mean 104 mm Hg  LV 132/22 mm Hg  Coronary angiography:  Coronary dominance: right  Left mainstem: Normal  Left anterior descending (LAD): 90% mid LAD with moderate calcification. The distal LAD has diffuse irregularities.  Left circumflex (LCx): less than 10% luminal irregularity  Right coronary artery (RCA): Mild proximal irregularity less than 20%.  Left ventriculography: Left ventricular systolic  function is normal, LVEF is estimated at 55%, there is mild mid to distal anterior wall hypokinesis, there is no significant mitral regurgitation  PCI Note: Following the diagnostic procedure, the decision was made to proceed with PCI. Weight-based bivalirudin was given for anticoagulation. Effient 60 mg was given orally. Once a therapeutic ACT was achieved, a 6 [place] XBLAD 3.5 guide catheter was inserted. A prowater coronary guidewire was used to cross the lesion. The lesion was predilated with a 2.5 mm compliant balloon. The lesion was then stented with a 3.5 x 28 mm Promus premier stent. The stent was postdilated with a 3.75 mm noncompliant balloon. Following PCI, there was 0% residual stenosis and TIMI-3 flow. Final angiography confirmed an excellent result. The patient tolerated the procedure well. There were no immediate procedural complications. A TR band was used for radial hemostasis. The patient was transferred to the post catheterization recovery area for further monitoring.  PCI Data:  Vessel - LAD/Segment - mid  Percent Stenosis (pre) 90%  TIMI-flow 3  Stent 3.5 x 28 mm Promus premier  Percent Stenosis (post) 0%    TIMI-flow (post) 3   Final Conclusions:  1. Single vessel obstructive CAD with high grade mid LAD stenosis.  2. Well preserved LV systolic function with anterior hypokinesis.  3. Successful stenting of the mid LAD with DES.  Recommendations:  Continue dual antiplatelet therapy for one year. Risk factor modification.   09/2012 Echo Study Conclusions  - Left ventricle: Wall thickness was increased in a pattern of mild LVH with disproportionate septal thickening. Systolic function was normal. The estimated ejection fraction was in the range of 55% to 60%. Wall motion was normal; there were no regional wall motion abnormalities. - Aortic valve: Mildly calcified annulus. Trileaflet; mildly thickened leaflets. - Left atrium: The atrium was mildly dilated. - Atrial septum: No defect or patent foramen ovale was identified. - Pulmonary arteries: PA peak pressure: 4mm Hg (S).  02/13/14 Clinic EKG NSR  09/2015 Nuclear stress test  No diagnostic ST segment changes to indicate ischemia. No arrhythmias. Low risk Duke treadmill score of 5.5.  No significant myocardial perfusion defects to indicate scar or ischemia.  This is a low risk study.  Nuclear stress EF: 70%.      Assessment and Plan   1. CAD - doing well without symptoms, continue current meds  2. HTN - bp at goal, continue current meds  3. DM type II - from cardiac standpoint she is on statin and ACE-I, continue current meds  4. Hyperlipidemia - continue statin, request labs from pcp     Arnoldo Lenis, M.D.

## 2017-12-07 NOTE — Patient Instructions (Signed)

## 2017-12-13 ENCOUNTER — Encounter: Payer: Self-pay | Admitting: *Deleted

## 2017-12-18 ENCOUNTER — Encounter: Payer: Self-pay | Admitting: Cardiology

## 2018-01-26 DIAGNOSIS — F3342 Major depressive disorder, recurrent, in full remission: Secondary | ICD-10-CM | POA: Diagnosis not present

## 2018-04-23 ENCOUNTER — Other Ambulatory Visit: Payer: Self-pay | Admitting: *Deleted

## 2018-04-23 MED ORDER — METOPROLOL TARTRATE 25 MG PO TABS: 12.5000 mg | ORAL_TABLET | Freq: Two times a day (BID) | ORAL | 1 refills | Status: DC

## 2018-04-23 MED ORDER — ROSUVASTATIN CALCIUM 5 MG PO TABS: 5.0000 mg | ORAL_TABLET | Freq: Every day | ORAL | 1 refills | Status: DC

## 2018-10-15 ENCOUNTER — Other Ambulatory Visit: Payer: Self-pay | Admitting: Cardiology

## 2018-11-18 ENCOUNTER — Other Ambulatory Visit: Payer: Self-pay | Admitting: Cardiology

## 2019-01-14 ENCOUNTER — Other Ambulatory Visit: Payer: Self-pay

## 2019-01-14 DIAGNOSIS — R6889 Other general symptoms and signs: Secondary | ICD-10-CM | POA: Diagnosis not present

## 2019-01-14 DIAGNOSIS — Z20822 Contact with and (suspected) exposure to covid-19: Secondary | ICD-10-CM

## 2019-01-16 LAB — NOVEL CORONAVIRUS, NAA: SARS-CoV-2, NAA: NOT DETECTED

## 2019-01-18 DIAGNOSIS — F3342 Major depressive disorder, recurrent, in full remission: Secondary | ICD-10-CM | POA: Diagnosis not present

## 2019-02-12 DIAGNOSIS — Z853 Personal history of malignant neoplasm of breast: Secondary | ICD-10-CM | POA: Diagnosis not present

## 2019-02-12 DIAGNOSIS — Z1231 Encounter for screening mammogram for malignant neoplasm of breast: Secondary | ICD-10-CM | POA: Diagnosis not present

## 2019-04-08 ENCOUNTER — Other Ambulatory Visit: Payer: Self-pay

## 2019-04-08 MED ORDER — ROSUVASTATIN CALCIUM 5 MG PO TABS: ORAL_TABLET | ORAL | 0 refills | Status: DC

## 2019-04-08 NOTE — Telephone Encounter (Signed)
30 day supply given,must make fu apt

## 2019-05-15 ENCOUNTER — Telehealth: Payer: Self-pay | Admitting: Cardiology

## 2019-05-15 ENCOUNTER — Other Ambulatory Visit: Payer: Self-pay

## 2019-05-15 MED ORDER — NITROGLYCERIN 0.4 MG SL SUBL: 0.4000 mg | SUBLINGUAL_TABLET | SUBLINGUAL | 1 refills | Status: AC | PRN

## 2019-05-15 MED ORDER — ROSUVASTATIN CALCIUM 5 MG PO TABS: ORAL_TABLET | ORAL | 0 refills | Status: DC

## 2019-05-15 MED ORDER — ROSUVASTATIN CALCIUM 5 MG PO TABS
ORAL_TABLET | ORAL | 0 refills | Status: DC
Start: 2019-05-15 — End: 2019-08-15

## 2019-05-15 NOTE — Telephone Encounter (Signed)
Have some open slots next week in [place], can add her on for next week   Zandra Abts MD

## 2019-05-15 NOTE — Telephone Encounter (Signed)
For the past several days, patient has been experiencing pain in spasms in between shoulder blades going into her breast no one side. When this happens she becomes SOB.  Even woke her up out of her sleep one night. Was not having chest pain at the time of the call

## 2019-05-15 NOTE — Telephone Encounter (Signed)
Patient states in addition to the spasm she feels in her upper back she had some chest pain while she was grocery shopping.She did not take NTG, says hers is old.I refilled the NTG and told her to use it as directed.She has an apt with [place] on 06/07/2019

## 2019-05-15 NOTE — Telephone Encounter (Signed)
VV made for [facility] office next week.

## 2019-05-21 ENCOUNTER — Telehealth: Payer: Self-pay | Admitting: Cardiology

## 2019-05-21 ENCOUNTER — Telehealth (INDEPENDENT_AMBULATORY_CARE_PROVIDER_SITE_OTHER): Payer: 59 | Admitting: Cardiology

## 2019-05-21 ENCOUNTER — Encounter: Payer: Self-pay | Admitting: Cardiology

## 2019-05-21 VITALS — BP 102/58 | HR 73 | Ht 63.5 in | Wt 193.0 lb

## 2019-05-21 DIAGNOSIS — R0789 Other chest pain: Secondary | ICD-10-CM

## 2019-05-21 DIAGNOSIS — I2510 Atherosclerotic heart disease of native coronary artery without angina pectoris: Secondary | ICD-10-CM

## 2019-05-21 NOTE — Addendum Note (Signed)
Addended by: Laurine Blazer on: 05/21/2019 11:09 AM   Modules accepted: Orders

## 2019-05-21 NOTE — Progress Notes (Signed)
Virtual Visit via Telephone Note   This visit type was conducted due to national recommendations for restrictions regarding the COVID-19 Pandemic (e.g. social distancing) in an effort to limit this patient's exposure and mitigate transmission in our community.  Due to her co-morbid illnesses, this patient is at least at moderate risk for complications without adequate follow up.  This format is felt to be most appropriate for this patient at this time.  The patient did not have access to video technology/had technical difficulties with video requiring transitioning to audio format only (telephone).  All issues noted in this document were discussed and addressed.  No physical exam could be performed with this format.  Please refer to the patient's chart for her  consent to telehealth for [facility]   Date:  05/21/2019   ID:  [name], DOB [id number], MRN [id number]  Patient Location: Home Provider Location: Office  PCP:  Manon Hilding, MD  Cardiologist:  Carlyle Dolly, MD  Electrophysiologist:  None   Evaluation Performed:  Follow-Up Visit  Chief Complaint:  Follow up visit  History of Present Illness:    [name] is a 59 y.o. female seen today for follow up of the following medical problems. This is a focused visit on recent symptoms of chest pain.    1. CAD - prior DES to LAD 09/2012, LVEF 55% by LVgram at that time. - 09/2012 echo LVEF 55-60% - 09/2015 exercise nuclear stress test without ischemia, low risk Duke treadmill score of 5.5.  Chest pain symptoms a few months ago thought to be gallbladder related - s/p lab chole 06/2016  Recent episodes of chest pains - woke from sleep. Spasm like between shoulder blades, SOB. Worst with position. +chills. Lasted a few days constant, but not severe. Would be brought on by position. Symptoms resolved  - while in grocery store had some chest heaviness. Midchest, 5/10 in severity. +SOB> Lasted 3 hours, coming and  going.  - compliant with meds     SH: [facility]  Works [employer]  The patient does not have symptoms concerning for COVID-19 infection (fever, chills, cough, or new shortness of breath).    Past Medical History:  Diagnosis Date  . Basal cell carcinoma 2013   Resected from left neck  . Breast carcinoma ([place])   . Complication of anesthesia   . Coronary artery disease    DES to LAD 2014  . Diabetes mellitus type 2 with complications, uncontrolled (HCC)    type 2  -- pills only  . Endometriosis    TAH/BSO in 2003  . Hyperlipidemia   . Hypertension   . Hypothyroidism   . Obesity   . PONV (postoperative nausea and vomiting)    severe--wants ? scop patch & drugs   Past Surgical History:  Procedure Laterality Date  . ABDOMINAL HYSTERECTOMY    . BREAST SURGERY    . CARDIAC CATHETERIZATION    . CHOLECYSTECTOMY N/A 06/28/2016   Procedure: LAPAROSCOPIC CHOLECYSTECTOMY WITH POSSIBLE INTRAOPERATIVE CHOLANGIOGRAM;  Surgeon: Stark Klein, MD;  Location: [facility];  Service: General;  Laterality: N/A;  . EYE SURGERY     cataract left eye  . LEFT HEART CATHETERIZATION WITH CORONARY ANGIOGRAM N/A 10/05/2012   Procedure: LEFT HEART CATHETERIZATION WITH CORONARY ANGIOGRAM;  Surgeon: Peter M Martinique, MD;  Location: [facility] CATH LAB;  Service: Cardiovascular;  Laterality: N/A;  . MASTECTOMY     left  . PERCUTANEOUS CORONARY STENT INTERVENTION (PCI-S)  10/05/2012   Procedure: PERCUTANEOUS CORONARY STENT INTERVENTION (PCI-S);  Surgeon: Peter M Martinique, MD;  Location: [facility] CATH LAB;  Service: Cardiovascular;;  . SKIN CANCER EXCISION  2013   Basal cell-left neck  . TOTAL ABDOMINAL HYSTERECTOMY W/ BILATERAL SALPINGOOPHORECTOMY  2003     No outpatient medications have been marked as taking for the 05/21/19 encounter (Appointment) with Arnoldo Lenis, MD.     Allergies:   Codeine, Demerol [meperidine], Dilaudid [hydromorphone hcl], Morphine and related,  and Atorvastatin   Social History   Tobacco Use  . Smoking status: Former Smoker    Packs/day: 1.50    Years: 10.00    Pack years: 15.00    Types: Cigarettes    Start date: 05/17/1975    Quit date: 09/03/1989    Years since quitting: 29.7  . Smokeless tobacco: Never Used  Substance Use Topics  . Alcohol use: Yes    Alcohol/week: 0.0 standard drinks    Comment: occasional  . Drug use: No     Family Hx: The patient's family history includes Heart disease in her father; Hypertension in her mother.  ROS:   Please see the history of present illness.     All other systems reviewed and are negative.   Prior CV studies:   The following studies were reviewed today:  09/2012 Cath PROCEDURAL FINDINGS  Hemodynamics:  AO 129/82 mean 104 mm Hg  LV 132/22 mm Hg  Coronary angiography:  Coronary dominance: right  Left mainstem: Normal  Left anterior descending (LAD): 90% mid LAD with moderate calcification. The distal LAD has diffuse irregularities.  Left circumflex (LCx): less than 10% luminal irregularity  Right coronary artery (RCA): Mild proximal irregularity less than 20%.  Left ventriculography: Left ventricular systolic function is normal, LVEF is estimated at 55%, there is mild mid to distal anterior wall hypokinesis, there is no significant mitral regurgitation  PCI Note: Following the diagnostic procedure, the decision was made to proceed with PCI. Weight-based bivalirudin was given for anticoagulation. Effient 60 mg was given orally. Once a therapeutic ACT was achieved, a 6 [place] XBLAD 3.5 guide catheter was inserted. A prowater coronary guidewire was used to cross the lesion. The lesion was predilated with a 2.5 mm compliant balloon. The lesion was then stented with a 3.5 x 28 mm Promus premier stent. The stent was postdilated with a 3.75 mm noncompliant balloon. Following PCI, there was 0% residual stenosis and TIMI-3 flow. Final angiography confirmed an excellent result. The  patient tolerated the procedure well. There were no immediate procedural complications. A TR band was used for radial hemostasis. The patient was transferred to the post catheterization recovery area for further monitoring.  PCI Data:  Vessel - LAD/Segment - mid  Percent Stenosis (pre) 90%  TIMI-flow 3  Stent 3.5 x 28 mm Promus premier  Percent Stenosis (post) 0%  TIMI-flow (post) 3   Final Conclusions:  1. Single vessel obstructive CAD with high grade mid LAD stenosis.  2. Well preserved LV systolic function with anterior hypokinesis.  3. Successful stenting of the mid LAD with DES.  Recommendations:  Continue dual antiplatelet therapy for one year. Risk factor modification.   09/2012 Echo Study Conclusions  - Left ventricle: Wall thickness was increased in a pattern of mild LVH with disproportionate septal thickening. Systolic function was normal. The estimated ejection fraction was in the range of 55% to 60%. Wall motion was normal; there were no regional wall motion abnormalities. - Aortic valve: Mildly calcified annulus. Trileaflet;  mildly thickened leaflets. - Left atrium: The atrium was mildly dilated. - Atrial septum: No defect or patent foramen ovale was identified. - Pulmonary arteries: PA peak pressure: 61mm Hg (S).  02/13/14 Clinic EKG NSR  09/2015 Nuclear stress test  No diagnostic ST segment changes to indicate ischemia. No arrhythmias. Low risk Duke treadmill score of 5.5.  No significant myocardial perfusion defects to indicate scar or ischemia.  This is a low risk study.  Nuclear stress EF: 70%.   Labs/Other Tests and Data Reviewed:    EKG:  No ECG reviewed.  Recent Labs: No results found for requested labs within last 8760 hours.   Recent Lipid Panel Lab Results  Component Value Date/Time   CHOL 272 (H) 10/03/2012 10:18 AM   TRIG 179 (H) 10/03/2012 10:18 AM   HDL 50 10/03/2012 10:18 AM   CHOLHDL 5.4 10/03/2012 10:18 AM   LDLCALC 186  (H) 10/03/2012 10:18 AM    Wt Readings from Last 3 Encounters:  12/07/17 204 lb (92.5 kg)  12/07/16 196 lb (88.9 kg)  06/28/16 182 lb (82.6 kg)     Objective:    Vital Signs:   Today's Vitals   05/21/19 1033  BP: (!) 102/58  Pulse: 73  Weight: 193 lb (87.5 kg)  Height: 5' 3.5" (1.613 m)   Body mass index is 33.65 kg/m. Normal affect. Normal speech pattern and tone. Comfortable, no apparent distress. No audible signs of SOB or wheezing.   ASSESSMENT & PLAN:    1. CAD -initial episode of spasm like pain between shoulder blades lasting days not consistent with cardiac etiology - subsequent chest heaviness while walking in the grocery store with associated SOB more concerning about about recurrent ischemic disease - will plan for lexiscan to further evaluate     COVID-19 Education: The signs and symptoms of COVID-19 were discussed with the patient and how to seek care for testing (follow up with PCP or arrange E-visit).  The importance of social distancing was discussed today.  Time:   Today, I have spent 16 minutes with the patient with telehealth technology discussing the above problems.     Medication Adjustments/Labs and Tests Ordered: Current medicines are reviewed at length with the patient today.  Concerns regarding medicines are outlined above.   Tests Ordered: No orders of the defined types were placed in this encounter.   Medication Changes: No orders of the defined types were placed in this encounter.   Follow Up:  Pending stress results  Signed, Carlyle Dolly, MD  05/21/2019 8:48 AM    [facility]

## 2019-05-21 NOTE — Telephone Encounter (Signed)
  Precert needed for: Lexiscan   Location: [facility]    Date: May 23, 2019

## 2019-05-21 NOTE — Patient Instructions (Signed)
Medication Instructions:  Continue all current medications.  Labwork: none  Testing/Procedures:  Your physician has requested that you have a lexiscan myoview. For further information please visit [email or web address]. Please follow instruction sheet, as given.  Office will contact with results via phone or letter.    Follow-Up: Follow up pending test results.   Any Other Special Instructions Will Be Listed Below (If Applicable).  If you need a refill on your cardiac medications before your next appointment, please call your pharmacy.

## 2019-05-22 ENCOUNTER — Telehealth: Payer: Self-pay | Admitting: Cardiology

## 2019-05-22 ENCOUNTER — Encounter: Payer: Self-pay | Admitting: *Deleted

## 2019-05-22 NOTE — Telephone Encounter (Signed)
Lexiscan instructions read to patient Verbalized understanding

## 2019-05-22 NOTE — Telephone Encounter (Signed)
Patient asking about if she should take medications before test

## 2019-05-22 NOTE — Telephone Encounter (Signed)
FYI For Dr. Harl Bowie he wanted test done this Thursday.  [2:07 PM] [name], we need to r/s [id number] until next week because her insurance ([place] ) required a faxed auth and 3 business days for the auth to be approved.     I have tried contacting patient to have her reschedule test

## 2019-05-23 ENCOUNTER — Encounter (HOSPITAL_COMMUNITY): Payer: 59

## 2019-05-23 ENCOUNTER — Ambulatory Visit (HOSPITAL_COMMUNITY)
Admission: RE | Admit: 2019-05-23 | Discharge: 2019-05-23 | Disposition: A | Discharge status: Home / Self Care | Payer: 59 | Source: Ambulatory Visit | Attending: Cardiology | Admitting: Cardiology

## 2019-05-23 ENCOUNTER — Encounter (HOSPITAL_BASED_OUTPATIENT_CLINIC_OR_DEPARTMENT_OTHER)
Admission: RE | Admit: 2019-05-23 | Discharge: 2019-05-23 | Disposition: A | Discharge status: Home / Self Care | Payer: 59 | Source: Ambulatory Visit | Attending: Cardiology | Admitting: Cardiology

## 2019-05-23 ENCOUNTER — Encounter (HOSPITAL_COMMUNITY): Payer: Self-pay

## 2019-05-23 ENCOUNTER — Other Ambulatory Visit: Payer: Self-pay

## 2019-05-23 DIAGNOSIS — R0789 Other chest pain: Secondary | ICD-10-CM | POA: Diagnosis present

## 2019-05-23 LAB — NM MYOCAR MULTI W/SPECT W/WALL MOTION / EF
LV dias vol: 70 mL (ref 46–106)
LV sys vol: 21 mL
Peak HR: 102 {beats}/min
RATE: 0.38
Rest HR: 75 {beats}/min
SDS: 0
SRS: 0
SSS: 0
TID: 1.15

## 2019-05-23 MED ORDER — REGADENOSON 0.4 MG/5ML IV SOLN
INTRAVENOUS | Status: AC
  Administered 2019-05-23: 0.4 mg via INTRAVENOUS
  Filled 2019-05-23: qty 5

## 2019-05-23 MED ORDER — TECHNETIUM TC 99M TETROFOSMIN IV KIT
30.0000 | PACK | Freq: Once | INTRAVENOUS | Status: AC | PRN
  Administered 2019-05-23: 30.8 via INTRAVENOUS

## 2019-05-23 MED ORDER — SODIUM CHLORIDE FLUSH 0.9 % IV SOLN
INTRAVENOUS | Status: AC
  Administered 2019-05-23: 10 mL via INTRAVENOUS
  Filled 2019-05-23: qty 10

## 2019-05-23 MED ORDER — TECHNETIUM TC 99M TETROFOSMIN IV KIT
10.0000 | PACK | Freq: Once | INTRAVENOUS | Status: AC | PRN
  Administered 2019-05-23: 10.8 via INTRAVENOUS

## 2019-05-28 ENCOUNTER — Telehealth: Payer: Self-pay | Admitting: Cardiology

## 2019-05-28 NOTE — Telephone Encounter (Signed)
Requesting results from test

## 2019-05-29 ENCOUNTER — Encounter (HOSPITAL_COMMUNITY): Payer: 59

## 2019-05-29 NOTE — Telephone Encounter (Signed)
normal test, pt notified

## 2019-06-07 ENCOUNTER — Ambulatory Visit: Payer: BC Managed Care – PPO | Admitting: Cardiology

## 2019-08-01 ENCOUNTER — Telehealth: Payer: Self-pay | Admitting: Licensed Clinical Social Worker

## 2019-08-01 ENCOUNTER — Telehealth (INDEPENDENT_AMBULATORY_CARE_PROVIDER_SITE_OTHER): Payer: 59 | Admitting: Cardiology

## 2019-08-01 ENCOUNTER — Encounter: Payer: Self-pay | Admitting: Cardiology

## 2019-08-01 VITALS — BP 128/62

## 2019-08-01 DIAGNOSIS — R0789 Other chest pain: Secondary | ICD-10-CM

## 2019-08-01 DIAGNOSIS — I2510 Atherosclerotic heart disease of native coronary artery without angina pectoris: Secondary | ICD-10-CM

## 2019-08-01 NOTE — Progress Notes (Signed)
Virtual Visit via Telephone Note   This visit type was conducted due to national recommendations for restrictions regarding the COVID-19 Pandemic (e.g. social distancing) in an effort to limit this patient's exposure and mitigate transmission in our community.  Due to her co-morbid illnesses, this patient is at least at moderate risk for complications without adequate follow up.  This format is felt to be most appropriate for this patient at this time.  The patient did not have access to video technology/had technical difficulties with video requiring transitioning to audio format only (telephone).  All issues noted in this document were discussed and addressed.  No physical exam could be performed with this format.  Please refer to the patient's chart for her  consent to telehealth for [facility].   The patient was identified using 2 identifiers.  Date:  08/01/2019   ID:  [name], DOB [birth year], MRN [id number]  Patient Location: Home Provider Location: Office  PCP:  Manon Hilding, MD  Cardiologist:  Carlyle Dolly, MD  Electrophysiologist:  None   Evaluation Performed:  Follow-Up Visit  Chief Complaint:  Follow up visit  History of Present Illness:    [name] is a 59 y.o. female seen today for a focused visit for her history of CAD and recent chest pain. For more detailed history please refer to prior clinic notes.   1. CAD - prior DES to LAD 09/2012, LVEF 55% by LVgram at that time. - 09/2012 echo LVEF 55-60% - 09/2015 exercise nuclear stress test without ischemia, low risk Duke treadmill score of 5.5.  Chest pain symptoms a few months ago thought to be gallbladder related - s/p lab chole 06/2016  Recent episodes of chest pains - woke from sleep. Spasm like between shoulder blades, SOB. Worst with position. +chills. Lasted a few days constant, but not severe. Would be brought on by position. Symptoms resolved  - while in grocery store had some chest  heaviness. Midchest, 5/10 in severity. +SOB> Lasted 3 hours, coming and going.  - compliant with meds   Jan 2021 nuclear stress no ischemia - no recurrent pain since last visit      SH: [facility] has [facility]. Works Advertising copywriter   The patient does not have symptoms concerning for COVID-19 infection (fever, chills, cough, or new shortness of breath).    Past Medical History:  Diagnosis Date  . Basal cell carcinoma 2013   Resected from left neck  . Breast carcinoma ([place])   . Complication of anesthesia   . Coronary artery disease    DES to LAD 2014  . Diabetes mellitus type 2 with complications, uncontrolled (HCC)    type 2  -- pills only  . Endometriosis    TAH/BSO in 2003  . Hyperlipidemia   . Hypertension   . Hypothyroidism   . Obesity   . PONV (postoperative nausea and vomiting)    severe--wants ? scop patch & drugs   Past Surgical History:  Procedure Laterality Date  . ABDOMINAL HYSTERECTOMY    . BREAST SURGERY    . CARDIAC CATHETERIZATION    . CHOLECYSTECTOMY N/A 06/28/2016   Procedure: LAPAROSCOPIC CHOLECYSTECTOMY WITH POSSIBLE INTRAOPERATIVE CHOLANGIOGRAM;  Surgeon: Stark Klein, MD;  Location: [facility];  Service: General;  Laterality: N/A;  . EYE SURGERY     cataract left eye  . LEFT HEART CATHETERIZATION WITH CORONARY ANGIOGRAM N/A 10/05/2012   Procedure: LEFT HEART CATHETERIZATION WITH CORONARY ANGIOGRAM;  Surgeon: Peter M Martinique,  MD;  Location: [place] CATH LAB;  Service: Cardiovascular;  Laterality: N/A;  . MASTECTOMY     left  . PERCUTANEOUS CORONARY STENT INTERVENTION (PCI-S)  10/05/2012   Procedure: PERCUTANEOUS CORONARY STENT INTERVENTION (PCI-S);  Surgeon: Peter M Martinique, MD;  Location: [facility] CATH LAB;  Service: Cardiovascular;;  . SKIN CANCER EXCISION  2013   Basal cell-left neck  . TOTAL ABDOMINAL HYSTERECTOMY W/ BILATERAL SALPINGOOPHORECTOMY  2003     Current Meds  Medication Sig  . acetaminophen (TYLENOL) 500 MG  tablet Take 1,000 mg by mouth every 6 (six) hours as needed (for pain.).  [name] aspirin EC 81 MG tablet Take 81 mg by mouth daily.  . citalopram (CELEXA) 40 MG tablet Take 60 mg by mouth daily.   [name] ibuprofen (ADVIL,MOTRIN) 200 MG tablet Take 3 tablets (600 mg total) by mouth every 6 (six) hours as needed for mild pain, moderate pain or cramping (for pain.).  [name] JARDIANCE 25 MG TABS tablet TK 1 T PO D  . levothyroxine (SYNTHROID, LEVOTHROID) 100 MCG tablet TK 1 T PO D  . lisinopril (PRINIVIL,ZESTRIL) 10 MG tablet Take 10 mg by mouth daily.  . metFORMIN (GLUCOPHAGE) 500 MG tablet Take 250-1,000 mg by mouth 2 (two) times daily. (250 mg in the morning & 1000 mg in the evening)  . metoprolol tartrate (LOPRESSOR) 25 MG tablet TAKE 1/2 TABLET(12.5 MG) BY MOUTH TWICE DAILY  . nitroGLYCERIN (NITROSTAT) 0.4 MG SL tablet Place 1 tablet (0.4 mg total) under the tongue every 5 (five) minutes as needed for chest pain.  . rosuvastatin (CRESTOR) 5 MG tablet TAKE 1 TABLET(5 MG) BY MOUTH DAILY  . VICTOZA 18 MG/3ML SOPN daily.      Allergies:   Codeine, Demerol [meperidine], Dilaudid [hydromorphone hcl], Morphine and related, and Atorvastatin   Social History   Tobacco Use  . Smoking status: Former Smoker    Packs/day: 1.50    Years: 10.00    Pack years: 15.00    Types: Cigarettes    Start date: 05/17/1975    Quit date: 09/03/1989    Years since quitting: 29.9  . Smokeless tobacco: Never Used  Substance Use Topics  . Alcohol use: Yes    Alcohol/week: 0.0 standard drinks    Comment: occasional  . Drug use: No     Family Hx: The patient's family history includes Heart disease in her father; Hypertension in her mother.  ROS:   Please see the history of present illness.     All other systems reviewed and are negative.   Prior CV studies:   The following studies were reviewed today:  09/2012 Cath PROCEDURAL FINDINGS  Hemodynamics:  AO 129/82 mean 104 mm Hg  LV 132/22 mm Hg  Coronary angiography:    Coronary dominance: right  Left mainstem: Normal  Left anterior descending (LAD): 90% mid LAD with moderate calcification. The distal LAD has diffuse irregularities.  Left circumflex (LCx): less than 10% luminal irregularity  Right coronary artery (RCA): Mild proximal irregularity less than 20%.  Left ventriculography: Left ventricular systolic function is normal, LVEF is estimated at 55%, there is mild mid to distal anterior wall hypokinesis, there is no significant mitral regurgitation  PCI Note: Following the diagnostic procedure, the decision was made to proceed with PCI. Weight-based bivalirudin was given for anticoagulation. Effient 60 mg was given orally. Once a therapeutic ACT was achieved, a 6 [place] XBLAD 3.5 guide catheter was inserted. A prowater coronary guidewire was used to cross the lesion. The lesion  was predilated with a 2.5 mm compliant balloon. The lesion was then stented with a 3.5 x 28 mm Promus premier stent. The stent was postdilated with a 3.75 mm noncompliant balloon. Following PCI, there was 0% residual stenosis and TIMI-3 flow. Final angiography confirmed an excellent result. The patient tolerated the procedure well. There were no immediate procedural complications. A TR band was used for radial hemostasis. The patient was transferred to the post catheterization recovery area for further monitoring.  PCI Data:  Vessel - LAD/Segment - mid  Percent Stenosis (pre) 90%  TIMI-flow 3  Stent 3.5 x 28 mm Promus premier  Percent Stenosis (post) 0%  TIMI-flow (post) 3   Final Conclusions:  1. Single vessel obstructive CAD with high grade mid LAD stenosis.  2. Well preserved LV systolic function with anterior hypokinesis.  3. Successful stenting of the mid LAD with DES.  Recommendations:  Continue dual antiplatelet therapy for one year. Risk factor modification.   09/2012 Echo Study Conclusions  - Left ventricle: Wall thickness was increased in a pattern of mild LVH  with disproportionate septal thickening. Systolic function was normal. The estimated ejection fraction was in the range of 55% to 60%. Wall motion was normal; there were no regional wall motion abnormalities. - Aortic valve: Mildly calcified annulus. Trileaflet; mildly thickened leaflets. - Left atrium: The atrium was mildly dilated. - Atrial septum: No defect or patent foramen ovale was identified. - Pulmonary arteries: PA peak pressure: 54mm Hg (S).  02/13/14 Clinic EKG NSR  09/2015 Nuclear stress test  No diagnostic ST segment changes to indicate ischemia. No arrhythmias. Low risk Duke treadmill score of 5.5.  No significant myocardial perfusion defects to indicate scar or ischemia.  This is a low risk study.  Nuclear stress EF: 70%.  Jan 2021 nuclear stress  There was no ST segment deviation noted during stress.  The study is normal.  This is a low risk study.  Nuclear stress EF: 70%.    Labs/Other Tests and Data Reviewed:    EKG:  No ECG reviewed.  Recent Labs: No results found for requested labs within last 8760 hours.   Recent Lipid Panel Lab Results  Component Value Date/Time   CHOL 272 (H) 10/03/2012 10:18 AM   TRIG 179 (H) 10/03/2012 10:18 AM   HDL 50 10/03/2012 10:18 AM   CHOLHDL 5.4 10/03/2012 10:18 AM   LDLCALC 186 (H) 10/03/2012 10:18 AM    Wt Readings from Last 3 Encounters:  05/21/19 193 lb (87.5 kg)  12/07/17 204 lb (92.5 kg)  12/07/16 196 lb (88.9 kg)     Objective:    Vital Signs:  BP 128/62    Normal affect. Normal speech pattern and tone. Comfortable, no apparent distress. No audible signs of sob or wheezing.   ASSESSMENT & PLAN:    1. CAD -somewhat atypical episode of chest pain, given her history we did pursue a nuclear stress test which showed no signs of ischemia - symptoms have since resolved - continue to monitor, continue current medical therapy.       COVID-19 Education: The signs and symptoms of COVID-19 were  discussed with the patient and how to seek care for testing (follow up with PCP or arrange E-visit).  The importance of social distancing was discussed today.  Time:   Today, I have spent 13 minutes with the patient with telehealth technology discussing the above problems.     Medication Adjustments/Labs and Tests Ordered: Current medicines are reviewed at length with  the patient today.  Concerns regarding medicines are outlined above.   Tests Ordered: No orders of the defined types were placed in this encounter.   Medication Changes: No orders of the defined types were placed in this encounter.   Follow Up:  Either In Person or Virtual in 6 month(s)  Signed, Carlyle Dolly, MD  08/01/2019 8:28 AM    [place]

## 2019-08-01 NOTE — Patient Instructions (Signed)

## 2019-08-01 NOTE — Telephone Encounter (Signed)
CSW referred to assist patient with obtaining a scale. CSW contacted patient to inform scale will be delivered to home. Patient grateful for support and assistance. CSW available as needed. Jackie Mykal Kirchman, LCSW, CCSW-MCS [phone]  

## 2019-08-15 ENCOUNTER — Other Ambulatory Visit: Payer: Self-pay

## 2019-08-15 MED ORDER — ROSUVASTATIN CALCIUM 5 MG PO TABS: ORAL_TABLET | ORAL | 1 refills | Status: DC

## 2019-08-15 NOTE — Telephone Encounter (Signed)
Refilled crestor to [facility] [address]

## 2019-11-14 ENCOUNTER — Other Ambulatory Visit: Payer: Self-pay | Admitting: Cardiology

## 2020-01-31 ENCOUNTER — Ambulatory Visit: Payer: 59 | Admitting: Cardiology

## 2020-02-25 ENCOUNTER — Ambulatory Visit: Payer: 59 | Admitting: Cardiology

## 2020-03-12 ENCOUNTER — Other Ambulatory Visit: Payer: Self-pay | Admitting: Cardiology

## 2020-08-06 ENCOUNTER — Ambulatory Visit: Payer: 59 | Admitting: Cardiology

## 2020-08-06 ENCOUNTER — Other Ambulatory Visit: Payer: Self-pay

## 2020-08-06 ENCOUNTER — Encounter: Payer: Self-pay | Admitting: Cardiology

## 2020-08-06 VITALS — BP 122/68 | HR 71 | Ht 64.0 in | Wt 206.0 lb

## 2020-08-06 DIAGNOSIS — E782 Mixed hyperlipidemia: Secondary | ICD-10-CM | POA: Diagnosis not present

## 2020-08-06 DIAGNOSIS — I2510 Atherosclerotic heart disease of native coronary artery without angina pectoris: Secondary | ICD-10-CM

## 2020-08-06 DIAGNOSIS — I10 Essential (primary) hypertension: Secondary | ICD-10-CM

## 2020-08-06 NOTE — Progress Notes (Signed)
Clinical Summary [name] is a 60 y.o.female  1. CAD - prior DES to LAD 09/2012, LVEF 55% by LVgram at that time. - 09/2012 echo LVEF 55-60% - 09/2015 exercise nuclear stress test without ischemia, low risk Duke treadmill score of 5.5.  Jan 2021 nuclear stress no ischemia  - no recent chest pain. No SOB or DOE - compliant with meds   2. Hyperlipidemia - nausea on atorva, previously changed to simva and later changed to crestor - tolerating crestor well  - compliant with statin Labs followed by pcp  3. HTN -compliant with meds  4. DM2 - followed by pcp Dr Quintin Alto   [facility]: [facility] has [facility]. Works Advertising copywriter  Past Medical History:  Diagnosis Date  . Basal cell carcinoma 2013   Resected from left neck  . Breast carcinoma ([place])   . Complication of anesthesia   . Coronary artery disease    DES to LAD 2014  . Diabetes mellitus type 2 with complications, uncontrolled (HCC)    type 2  -- pills only  . Endometriosis    TAH/BSO in 2003  . Hyperlipidemia   . Hypertension   . Hypothyroidism   . Obesity   . PONV (postoperative nausea and vomiting)    severe--wants ? scop patch & drugs     Allergies  Allergen Reactions  . Codeine Nausea And Vomiting    "deathly sick"  . Demerol [Meperidine]     Pt states all pain meds make her sick on her stomach  . Dilaudid [Hydromorphone Hcl] Nausea And Vomiting  . Morphine And Related Nausea And Vomiting  . Atorvastatin Nausea And Vomiting     Current Outpatient Medications  Medication Sig Dispense Refill  . acetaminophen (TYLENOL) 500 MG tablet Take 1,000 mg by mouth every 6 (six) hours as needed (for pain.).    [name] aspirin EC 81 MG tablet Take 81 mg by mouth daily.    . citalopram (CELEXA) 40 MG tablet Take 60 mg by mouth daily.     [name] ibuprofen (ADVIL,MOTRIN) 200 MG tablet Take 3 tablets (600 mg total) by mouth every 6 (six) hours as needed for mild pain, moderate pain or  cramping (for pain.). 30 tablet 0  . JARDIANCE 25 MG TABS tablet TK 1 T PO D  6  . levothyroxine (SYNTHROID, LEVOTHROID) 100 MCG tablet TK 1 T PO D  12  . lisinopril (PRINIVIL,ZESTRIL) 10 MG tablet Take 10 mg by mouth daily.    . metFORMIN (GLUCOPHAGE) 500 MG tablet Take 250-1,000 mg by mouth 2 (two) times daily. (250 mg in the morning & 1000 mg in the evening)    . metoprolol tartrate (LOPRESSOR) 25 MG tablet TAKE 1/2 TABLET(12.5 MG) BY MOUTH TWICE DAILY 90 tablet 3  . nitroGLYCERIN (NITROSTAT) 0.4 MG SL tablet Place 1 tablet (0.4 mg total) under the tongue every 5 (five) minutes as needed for chest pain. 25 tablet 1  . rosuvastatin (CRESTOR) 5 MG tablet TAKE 1 TABLET(5 MG) BY MOUTH DAILY 90 tablet 3  . VICTOZA 18 MG/3ML SOPN daily.   5   No current facility-administered medications for this visit.     Past Surgical History:  Procedure Laterality Date  . ABDOMINAL HYSTERECTOMY    . BREAST SURGERY    . CARDIAC CATHETERIZATION    . CHOLECYSTECTOMY N/A 06/28/2016   Procedure: LAPAROSCOPIC CHOLECYSTECTOMY WITH POSSIBLE INTRAOPERATIVE CHOLANGIOGRAM;  Surgeon: Stark Klein, MD;  Location: [facility];  Service: General;  Laterality: N/A;  . EYE SURGERY     cataract left eye  . LEFT HEART CATHETERIZATION WITH CORONARY ANGIOGRAM N/A 10/05/2012   Procedure: LEFT HEART CATHETERIZATION WITH CORONARY ANGIOGRAM;  Surgeon: Peter M Martinique, MD;  [facility] C[facility] LAB;  Service: Cardiovascular;  Laterality: N/A;  . MASTECTOMY     left  . PERCUTANEOUS CORONARY STENT INTERVENTION (PCI-S)  10/05/2012   Procedure: PERCUTANEOUS CORONARY STENT INTERVENTION (PCI-S);  Surgeon: Peter M Martinique, MD;  Location: [facility] CATH LAB;  Service: Cardiovascular;;  . SKIN CANCER EXCISION  2013   Basal cell-left neck  . TOTAL ABDOMINAL HYSTERECTOMY W/ BILATERAL SALPINGOOPHORECTOMY  2003     Allergies  Allergen Reactions  . Codeine Nausea And Vomiting    "deathly sick"  . Demerol [Meperidine]     Pt states all pain meds make  her sick on her stomach  . Dilaudid [Hydromorphone Hcl] Nausea And Vomiting  . Morphine And Related Nausea And Vomiting  . Atorvastatin Nausea And Vomiting      Family History  Problem Relation Age of Onset  . Hypertension Mother   . Heart disease Father      Social History [name] reports that she quit smoking about 30 years ago. Her smoking use included cigarettes. She started smoking about 45 years ago. She has a 15.00 pack-year smoking history. She has never used smokeless tobacco. [name] reports current alcohol use.   Review of Systems CONSTITUTIONAL: No weight loss, fever, chills, weakness or fatigue.  HEENT: Eyes: No visual loss, blurred vision, double vision or yellow sclerae.No hearing loss, sneezing, congestion, runny nose or sore throat.  SKIN: No rash or itching.  CARDIOVASCULAR: per hpi RESPIRATORY: No shortness of breath, cough or sputum.  GASTROINTESTINAL: No anorexia, nausea, vomiting or diarrhea. No abdominal pain or blood.  GENITOURINARY: No burning on urination, no polyuria NEUROLOGICAL: No headache, dizziness, syncope, paralysis, ataxia, numbness or tingling in the extremities. No change in bowel or bladder control.  MUSCULOSKELETAL: No muscle, back pain, joint pain or stiffness.  LYMPHATICS: No enlarged nodes. No history of splenectomy.  PSYCHIATRIC: No history of depression or anxiety.  ENDOCRINOLOGIC: No reports of sweating, cold or heat intolerance. No polyuria or polydipsia.  [name]   Physical Examination Today's Vitals   08/06/20 0821  BP: 122/68  Pulse: 71  SpO2: 97%  Weight: 206 lb (93.4 kg)  Height: 5\' 4"  (1.626 m)   Body mass index is 35.36 kg/m.  Gen: resting comfortably, no acute distress HEENT: no scleral icterus, pupils equal round and reactive, no palptable cervical adenopathy,  CV: RRR, no m/r/g, no jvd Resp: Clear to auscultation bilaterally GI: abdomen is soft, non-tender, non-distended, normal bowel sounds, no  hepatosplenomegaly MSK: extremities are warm, no edema.  Skin: warm, no rash Neuro:  no focal deficits Psych: appropriate affect   Diagnostic Studies 09/2012 Cath PROCEDURAL FINDINGS  Hemodynamics:  AO 129/82 mean 104 mm Hg  LV 132/22 mm Hg  Coronary angiography:  Coronary dominance: right  Left mainstem: Normal  Left anterior descending (LAD): 90% mid LAD with moderate calcification. The distal LAD has diffuse irregularities.  Left circumflex (LCx): less than 10% luminal irregularity  Right coronary artery (RCA): Mild proximal irregularity less than 20%.  Left ventriculography: Left ventricular systolic function is normal, LVEF is estimated at 55%, there is mild mid to distal anterior wall hypokinesis, there is no significant mitral regurgitation  PCI Note: Following the diagnostic procedure, the decision was made to proceed with PCI. Weight-based bivalirudin was  given for anticoagulation. Effient 60 mg was given orally. Once a therapeutic ACT was achieved, a 6 [place] XBLAD 3.5 guide catheter was inserted. A prowater coronary guidewire was used to cross the lesion. The lesion was predilated with a 2.5 mm compliant balloon. The lesion was then stented with a 3.5 x 28 mm Promus premier stent. The stent was postdilated with a 3.75 mm noncompliant balloon. Following PCI, there was 0% residual stenosis and TIMI-3 flow. Final angiography confirmed an excellent result. The patient tolerated the procedure well. There were no immediate procedural complications. A TR band was used for radial hemostasis. The patient was transferred to the post catheterization recovery area for further monitoring.  PCI Data:  Vessel - LAD/Segment - mid  Percent Stenosis (pre) 90%  TIMI-flow 3  Stent 3.5 x 28 mm Promus premier  Percent Stenosis (post) 0%  TIMI-flow (post) 3   Final Conclusions:  1. Single vessel obstructive CAD with high grade mid LAD stenosis.  2. Well preserved LV systolic function with anterior  hypokinesis.  3. Successful stenting of the mid LAD with DES.  Recommendations:  Continue dual antiplatelet therapy for one year. Risk factor modification.   09/2012 Echo Study Conclusions  - Left ventricle: Wall thickness was increased in a pattern of mild LVH with disproportionate septal thickening. Systolic function was normal. The estimated ejection fraction was in the range of 55% to 60%. Wall motion was normal; there were no regional wall motion abnormalities. - Aortic valve: Mildly calcified annulus. Trileaflet; mildly thickened leaflets. - Left atrium: The atrium was mildly dilated. - Atrial septum: No defect or patent foramen ovale was identified. - Pulmonary arteries: PA peak pressure: 48mm Hg (S).  02/13/14 Clinic EKG NSR  09/2015 Nuclear stress test  No diagnostic ST segment changes to indicate ischemia. No arrhythmias. Low risk Duke treadmill score of 5.5.  No significant myocardial perfusion defects to indicate scar or ischemia.  This is a low risk study.  Nuclear stress EF: 70%.  Jan 2021 nuclear stress  There was no ST segment deviation noted during stress.  The study is normal.  This is a low risk study.  Nuclear stress EF: 70%.     Assessment and Plan  1. CAD -no recent symptoms -continue current meds - EKG today shows NSR, no ischemic changes  2. HTN - she is at goal, continue curren tmeds  3. Hyperlipidemia - request labs from pcp, continue statin   F/u 6 months    Arnoldo Lenis, M.D.,

## 2020-08-06 NOTE — Patient Instructions (Signed)
Medication Instructions:  Your physician recommends that you continue on your current medications as directed. Please refer to the Current Medication list given to you today.  *If you need a refill on your cardiac medications before your next appointment, please call your pharmacy*   Lab Work: Requesting labs from [facility] Provider If you have labs (blood work) drawn today and your tests are completely normal, you will receive your results only by: [name] MyChart Message (if you have MyChart) OR . A paper copy in the mail If you have any lab test that is abnormal or we need to change your treatment, we will call you to review the results.   Testing/Procedures: None   Follow-Up: At [facility], you and your health needs are our priority.  As part of our continuing mission to provide you with exceptional heart care, we have created designated Provider Care Teams.  These Care Teams include your primary Cardiologist (physician) and Advanced Practice Providers (APPs -  Physician Assistants and Nurse Practitioners) who all work together to provide you with the care you need, when you need it.  We recommend signing up for the patient portal called "MyChart".  Sign up information is provided on this After Visit Summary.  MyChart is used to connect with patients for Virtual Visits (Telemedicine).  Patients are able to view lab/test results, encounter notes, upcoming appointments, etc.  Non-urgent messages can be sent to your provider as well.   To learn more about what you can do with MyChart, go to [email or web address].    Your next appointment:   6 month(s)  The format for your next appointment:   In Person  Provider:   Carlyle Dolly, MD   Other Instructions None

## 2020-08-06 NOTE — Addendum Note (Signed)
Addended by: Berlinda Last on: 08/06/2020 08:48 AM   Modules accepted: Orders

## 2020-08-08 ENCOUNTER — Other Ambulatory Visit: Payer: Self-pay | Admitting: Cardiology

## 2021-02-09 ENCOUNTER — Other Ambulatory Visit: Payer: Self-pay | Admitting: Cardiology

## 2021-06-14 DIAGNOSIS — M7710 Lateral epicondylitis, unspecified elbow: Secondary | ICD-10-CM | POA: Diagnosis not present

## 2021-06-14 DIAGNOSIS — R69 Illness, unspecified: Secondary | ICD-10-CM | POA: Diagnosis not present

## 2021-06-14 DIAGNOSIS — Z6835 Body mass index (BMI) 35.0-35.9, adult: Secondary | ICD-10-CM | POA: Diagnosis not present

## 2021-06-14 DIAGNOSIS — E782 Mixed hyperlipidemia: Secondary | ICD-10-CM | POA: Diagnosis not present

## 2021-06-14 DIAGNOSIS — E7849 Other hyperlipidemia: Secondary | ICD-10-CM | POA: Diagnosis not present

## 2021-06-14 DIAGNOSIS — E039 Hypothyroidism, unspecified: Secondary | ICD-10-CM | POA: Diagnosis not present

## 2021-06-14 DIAGNOSIS — E1165 Type 2 diabetes mellitus with hyperglycemia: Secondary | ICD-10-CM | POA: Diagnosis not present

## 2021-06-14 DIAGNOSIS — I2510 Atherosclerotic heart disease of native coronary artery without angina pectoris: Secondary | ICD-10-CM | POA: Diagnosis not present

## 2021-06-14 DIAGNOSIS — I10 Essential (primary) hypertension: Secondary | ICD-10-CM | POA: Diagnosis not present

## 2021-06-14 DIAGNOSIS — E1159 Type 2 diabetes mellitus with other circulatory complications: Secondary | ICD-10-CM | POA: Diagnosis not present

## 2021-06-21 ENCOUNTER — Other Ambulatory Visit: Payer: Self-pay | Admitting: Cardiology

## 2021-06-26 ENCOUNTER — Other Ambulatory Visit: Payer: Self-pay | Admitting: Cardiology

## 2021-07-12 ENCOUNTER — Other Ambulatory Visit: Payer: Self-pay | Admitting: Cardiology

## 2021-08-04 ENCOUNTER — Telehealth: Payer: Self-pay | Admitting: Cardiology

## 2021-08-04 MED ORDER — METOPROLOL TARTRATE 25 MG PO TABS: ORAL_TABLET | ORAL | 3 refills | Status: AC

## 2021-08-04 NOTE — Telephone Encounter (Signed)
?*  STAT* If patient is at the pharmacy, call can be transferred to refill team. ? ? ?1. Which medications need to be refilled? (please list name of each medication and dose if known)  ?metoprolol tartrate (LOPRESSOR) 25 MG tablet ? ?2. Which pharmacy/location (including street and city if local pharmacy) is medication to be sent to? [place], [place] ? ?3. Do they need a 30 day or 90 day supply? 90 day supply   ?

## 2021-08-04 NOTE — Telephone Encounter (Signed)
Refill complete 

## 2021-08-31 ENCOUNTER — Other Ambulatory Visit: Payer: Self-pay | Admitting: Cardiology

## 2021-09-27 DIAGNOSIS — I10 Essential (primary) hypertension: Secondary | ICD-10-CM | POA: Diagnosis not present

## 2021-09-27 DIAGNOSIS — K2100 Gastro-esophageal reflux disease with esophagitis, without bleeding: Secondary | ICD-10-CM | POA: Diagnosis not present

## 2021-09-27 DIAGNOSIS — E782 Mixed hyperlipidemia: Secondary | ICD-10-CM | POA: Diagnosis not present

## 2021-09-27 DIAGNOSIS — E7849 Other hyperlipidemia: Secondary | ICD-10-CM | POA: Diagnosis not present

## 2021-09-27 DIAGNOSIS — E1165 Type 2 diabetes mellitus with hyperglycemia: Secondary | ICD-10-CM | POA: Diagnosis not present

## 2021-10-02 DIAGNOSIS — L237 Allergic contact dermatitis due to plants, except food: Secondary | ICD-10-CM | POA: Diagnosis not present

## 2021-10-02 DIAGNOSIS — I10 Essential (primary) hypertension: Secondary | ICD-10-CM | POA: Diagnosis not present

## 2021-10-02 DIAGNOSIS — Z6836 Body mass index (BMI) 36.0-36.9, adult: Secondary | ICD-10-CM | POA: Diagnosis not present

## 2021-10-04 DIAGNOSIS — R69 Illness, unspecified: Secondary | ICD-10-CM | POA: Diagnosis not present

## 2021-10-04 DIAGNOSIS — I2510 Atherosclerotic heart disease of native coronary artery without angina pectoris: Secondary | ICD-10-CM | POA: Diagnosis not present

## 2021-10-04 DIAGNOSIS — E1159 Type 2 diabetes mellitus with other circulatory complications: Secondary | ICD-10-CM | POA: Diagnosis not present

## 2021-10-04 DIAGNOSIS — E039 Hypothyroidism, unspecified: Secondary | ICD-10-CM | POA: Diagnosis not present

## 2021-10-04 DIAGNOSIS — E7849 Other hyperlipidemia: Secondary | ICD-10-CM | POA: Diagnosis not present

## 2021-10-04 DIAGNOSIS — Z6835 Body mass index (BMI) 35.0-35.9, adult: Secondary | ICD-10-CM | POA: Diagnosis not present

## 2021-10-04 DIAGNOSIS — E1165 Type 2 diabetes mellitus with hyperglycemia: Secondary | ICD-10-CM | POA: Diagnosis not present

## 2021-10-04 DIAGNOSIS — I10 Essential (primary) hypertension: Secondary | ICD-10-CM | POA: Diagnosis not present

## 2021-10-08 ENCOUNTER — Ambulatory Visit: Payer: Self-pay | Admitting: Physician Assistant

## 2021-10-12 DIAGNOSIS — R21 Rash and other nonspecific skin eruption: Secondary | ICD-10-CM | POA: Diagnosis not present

## 2021-10-12 DIAGNOSIS — Z6835 Body mass index (BMI) 35.0-35.9, adult: Secondary | ICD-10-CM | POA: Diagnosis not present

## 2021-10-12 DIAGNOSIS — I10 Essential (primary) hypertension: Secondary | ICD-10-CM | POA: Diagnosis not present

## 2021-12-02 ENCOUNTER — Other Ambulatory Visit: Payer: Self-pay | Admitting: Cardiology

## 2021-12-31 ENCOUNTER — Other Ambulatory Visit: Payer: Self-pay | Admitting: Cardiology

## 2022-02-03 DIAGNOSIS — I2510 Atherosclerotic heart disease of native coronary artery without angina pectoris: Secondary | ICD-10-CM | POA: Diagnosis not present

## 2022-02-03 DIAGNOSIS — R69 Illness, unspecified: Secondary | ICD-10-CM | POA: Diagnosis not present

## 2022-02-03 DIAGNOSIS — E039 Hypothyroidism, unspecified: Secondary | ICD-10-CM | POA: Diagnosis not present

## 2022-02-03 DIAGNOSIS — E1159 Type 2 diabetes mellitus with other circulatory complications: Secondary | ICD-10-CM | POA: Diagnosis not present

## 2022-02-03 DIAGNOSIS — E781 Pure hyperglyceridemia: Secondary | ICD-10-CM | POA: Diagnosis not present

## 2022-02-03 DIAGNOSIS — E7849 Other hyperlipidemia: Secondary | ICD-10-CM | POA: Diagnosis not present

## 2022-02-03 DIAGNOSIS — K2100 Gastro-esophageal reflux disease with esophagitis, without bleeding: Secondary | ICD-10-CM | POA: Diagnosis not present

## 2022-02-03 DIAGNOSIS — Z6834 Body mass index (BMI) 34.0-34.9, adult: Secondary | ICD-10-CM | POA: Diagnosis not present

## 2022-02-03 DIAGNOSIS — E1165 Type 2 diabetes mellitus with hyperglycemia: Secondary | ICD-10-CM | POA: Diagnosis not present

## 2022-02-03 DIAGNOSIS — I10 Essential (primary) hypertension: Secondary | ICD-10-CM | POA: Diagnosis not present

## 2022-02-03 DIAGNOSIS — F3341 Major depressive disorder, recurrent, in partial remission: Secondary | ICD-10-CM | POA: Diagnosis not present

## 2022-02-14 ENCOUNTER — Encounter: Payer: Self-pay | Admitting: Family Medicine

## 2022-02-14 ENCOUNTER — Ambulatory Visit: Payer: 59 | Attending: Cardiology | Admitting: Cardiology

## 2022-02-14 ENCOUNTER — Encounter: Payer: Self-pay | Admitting: Cardiology

## 2022-02-14 ENCOUNTER — Encounter: Payer: Self-pay | Admitting: *Deleted

## 2022-02-14 VITALS — BP 116/66 | HR 74 | Ht 63.0 in | Wt 194.0 lb

## 2022-02-14 DIAGNOSIS — E782 Mixed hyperlipidemia: Secondary | ICD-10-CM | POA: Diagnosis not present

## 2022-02-14 DIAGNOSIS — I10 Essential (primary) hypertension: Secondary | ICD-10-CM | POA: Diagnosis not present

## 2022-02-14 DIAGNOSIS — I2510 Atherosclerotic heart disease of native coronary artery without angina pectoris: Secondary | ICD-10-CM

## 2022-02-14 NOTE — Patient Instructions (Signed)
Medication Instructions:  Your physician recommends that you continue on your current medications as directed. Please refer to the Current Medication list given to you today.  *If you need a refill on your cardiac medications before your next appointment, please call your pharmacy*   Lab Work: NONE   If you have labs (blood work) drawn today and your tests are completely normal, you will receive your results only by: [place] (if you have MyChart) OR A paper copy in the mail If you have any lab test that is abnormal or we need to change your treatment, we will call you to review the results.   Testing/Procedures: NONE    Follow-Up: At [facility], you and your health needs are our priority.  As part of our continuing mission to provide you with exceptional heart care, we have created designated Provider Care Teams.  These Care Teams include your primary Cardiologist (physician) and Advanced Practice Providers (APPs -  Physician Assistants and Nurse Practitioners) who all work together to provide you with the care you need, when you need it.  We recommend signing up for the patient portal called "MyChart".  Sign up information is provided on this After Visit Summary.  MyChart is used to connect with patients for Virtual Visits (Telemedicine).  Patients are able to view lab/test results, encounter notes, upcoming appointments, etc.  Non-urgent messages can be sent to your provider as well.   To learn more about what you can do with MyChart, go to [email or web address].    Your next appointment:   1 year(s)  The format for your next appointment:   In Person  Provider:   You may see Carlyle Dolly, MD or one of the following Advanced Practice Providers on your designated Care Team:   Bernerd Pho, PA-C  Ermalinda Barrios, [place]     Other Instructions Thank you for choosing [facility]!    Important Information About Sugar

## 2022-02-14 NOTE — Progress Notes (Signed)
Clinical Summary [name] is a 61 y.o.female  1. CAD - prior DES to LAD 09/2012, LVEF 55% by LVgram at that time. -  09/2012 echo LVEF 55-60%  - 09/2015 exercise nuclear stress test without ischemia, low risk Duke treadmill score of 5.5.  Jan 2021 nuclear stress no ischemia   - no chest pain, no SOB/DOE - compliant withmeds      2. Hyperlipidemia - nausea on atorva, previously changed to simva and later changed to crestor - tolerating crestor well   - recent labs with pcp   3. HTN - compliant withmeds   4. DM2 - followed by pcp Dr Quintin Alto     [facility]: husband has metastatic melanoma, being treated at [facility].  Works Advertising copywriter    Past Medical History:  Diagnosis Date   Basal cell carcinoma 2013   Resected from left neck   Breast carcinoma ([place])    Complication of anesthesia    Coronary artery disease    DES to LAD 2014   Diabetes mellitus type 2 with complications, uncontrolled ([place])    type 2  -- pills only   Endometriosis    TAH/BSO in 2003   Hyperlipidemia    Hypertension    Hypothyroidism    Obesity    PONV (postoperative nausea and vomiting)    severe--wants ? scop patch & drugs     Allergies  Allergen Reactions   Codeine Nausea And Vomiting    "deathly sick"   Demerol [Meperidine]     Pt states all pain meds make her sick on her stomach   Dilaudid [Hydromorphone Hcl] Nausea And Vomiting   Morphine And Related Nausea And Vomiting   Atorvastatin Nausea And Vomiting     Current Outpatient Medications  Medication Sig Dispense Refill   acetaminophen (TYLENOL) 500 MG tablet Take 1,000 mg by mouth every 6 (six) hours as needed (for pain.).     aspirin EC 81 MG tablet Take 81 mg by mouth daily.     citalopram (CELEXA) 40 MG tablet Take 60 mg by mouth daily.      ibuprofen (ADVIL,MOTRIN) 200 MG tablet Take 3 tablets (600 mg total) by mouth every 6 (six) hours as needed for mild pain, moderate pain or cramping (for pain.). 30 tablet 0   JARDIANCE 25  MG TABS tablet TK 1 T PO D  6   levothyroxine (SYNTHROID, LEVOTHROID) 100 MCG tablet TK 1 T PO D  12   lisinopril (PRINIVIL,ZESTRIL) 10 MG tablet Take 10 mg by mouth daily.     metFORMIN (GLUCOPHAGE) 500 MG tablet Take 250-1,000 mg by mouth 2 (two) times daily. (250 mg in the morning & 1000 mg in the evening)     metoprolol tartrate (LOPRESSOR) 25 MG tablet TAKE 1/2 TABLET(12.5 MG) BY MOUTH TWICE DAILY 90 tablet 3   nitroGLYCERIN (NITROSTAT) 0.4 MG SL tablet Place 1 tablet (0.4 mg total) under the tongue every 5 (five) minutes as needed for chest pain. 25 tablet 1   rosuvastatin (CRESTOR) 5 MG tablet TAKE 1 TABLET BY MOUTH EVERY DAY 90 tablet 0   VICTOZA 18 MG/3ML SOPN daily.   5   No current facility-administered medications for this visit.     Past Surgical History:  Procedure Laterality Date   ABDOMINAL HYSTERECTOMY     BREAST SURGERY     CARDIAC CATHETERIZATION     CHOLECYSTECTOMY N/A 06/28/2016   Procedure: LAPAROSCOPIC CHOLECYSTECTOMY WITH POSSIBLE INTRAOPERATIVE CHOLANGIOGRAM;  Surgeon: Dorris Fetch  Barry Dienes, MD;  Location: [facility];  Service: General;  Laterality: N/A;   EYE SURGERY     cataract left eye   LEFT HEART CATHETERIZATION WITH CORONARY ANGIOGRAM N/A 10/05/2012   Procedure: LEFT HEART CATHETERIZATION WITH CORONARY ANGIOGRAM;  Surgeon: Peter M Martinique, MD;  Location: [facility] CATH LAB;  Service: Cardiovascular;  Laterality: N/A;   MASTECTOMY     left   PERCUTANEOUS CORONARY STENT INTERVENTION (PCI-S)  10/05/2012   Procedure: PERCUTANEOUS CORONARY STENT INTERVENTION (PCI-S);  Surgeon: Peter M Martinique, MD;  Location: [facility] CATH LAB;  Service: Cardiovascular;;   SKIN CANCER EXCISION  2013   Basal cell-left neck   TOTAL ABDOMINAL HYSTERECTOMY W/ BILATERAL SALPINGOOPHORECTOMY  2003     Allergies  Allergen Reactions   Codeine Nausea And Vomiting    "deathly sick"   Demerol [Meperidine]     Pt states all pain meds make her sick on her stomach   Dilaudid [Hydromorphone Hcl] Nausea And  Vomiting   Morphine And Related Nausea And Vomiting   Atorvastatin Nausea And Vomiting      Family History  Problem Relation Age of Onset   Hypertension Mother    Heart disease Father      Social History [name] reports that she quit smoking about 32 years ago. Her smoking use included cigarettes. She started smoking about 46 years ago. She has a 15.00 pack-year smoking history. She has never used smokeless tobacco. [name] reports current alcohol use.   Review of Systems CONSTITUTIONAL: No weight loss, fever, chills, weakness or fatigue.  HEENT: Eyes: No visual loss, blurred vision, double vision or yellow sclerae.No hearing loss, sneezing, congestion, runny nose or sore throat.  SKIN: No rash or itching.  CARDIOVASCULAR: per hpi RESPIRATORY: No shortness of breath, cough or sputum.  GASTROINTESTINAL: No anorexia, nausea, vomiting or diarrhea. No abdominal pain or blood.  GENITOURINARY: No burning on urination, no polyuria NEUROLOGICAL: No headache, dizziness, syncope, paralysis, ataxia, numbness or tingling in the extremities. No change in bowel or bladder control.  MUSCULOSKELETAL: No muscle, back pain, joint pain or stiffness.  LYMPHATICS: No enlarged nodes. No history of splenectomy.  PSYCHIATRIC: No history of depression or anxiety.  ENDOCRINOLOGIC: No reports of sweating, cold or heat intolerance. No polyuria or polydipsia.  [name]   Physical Examination Today's Vitals   02/14/22 0843  BP: 116/66  Pulse: 74  SpO2: 97%  Weight: 194 lb (88 kg)  Height: '5\' 3"'$  (1.6 m)   Body mass index is 34.37 kg/m.  Gen: resting comfortably, no acute distress HEENT: no scleral icterus, pupils equal round and reactive, no palptable cervical adenopathy,  CV: RRR, no m/r/g no jvd Resp: Clear to auscultation bilaterally GI: abdomen is soft, non-tender, non-distended, normal bowel sounds, no hepatosplenomegaly MSK: extremities are warm, no edema.  Skin: warm, no rash Neuro:   no focal deficits Psych: appropriate affect   Diagnostic Studies  09/2012 Cath PROCEDURAL FINDINGS  Hemodynamics:  AO 129/82 mean 104 mm Hg  LV 132/22 mm Hg  Coronary angiography:  Coronary dominance: right  Left mainstem: Normal  Left anterior descending (LAD): 90% mid LAD with moderate calcification. The distal LAD has diffuse irregularities.  Left circumflex (LCx): less than 10% luminal irregularity  Right coronary artery (RCA): Mild proximal irregularity less than 20%.  Left ventriculography: Left ventricular systolic function is normal, LVEF is estimated at 55%, there is mild mid to distal anterior wall hypokinesis, there is no significant mitral regurgitation  PCI Note: Following the diagnostic procedure,  the decision was made to proceed with PCI. Weight-based bivalirudin was given for anticoagulation. Effient 60 mg was given orally. Once a therapeutic ACT was achieved, a 6 [place] XBLAD 3.5 guide catheter was inserted. A prowater coronary guidewire was used to cross the lesion. The lesion was predilated with a 2.5 mm compliant balloon. The lesion was then stented with a 3.5 x 28 mm Promus premier stent. The stent was postdilated with a 3.75 mm noncompliant balloon. Following PCI, there was 0% residual stenosis and TIMI-3 flow. Final angiography confirmed an excellent result. The patient tolerated the procedure well. There were no immediate procedural complications. A TR band was used for radial hemostasis. The patient was transferred to the post catheterization recovery area for further monitoring.  PCI Data:  Vessel - LAD/Segment - mid  Percent Stenosis (pre) 90%  TIMI-flow 3  Stent 3.5 x 28 mm Promus premier  Percent Stenosis (post) 0%  TIMI-flow (post) 3    Final Conclusions:  1. Single vessel obstructive CAD with high grade mid LAD stenosis.  2. Well preserved LV systolic function with anterior hypokinesis.  3. Successful stenting of the mid LAD with DES.  Recommendations:   Continue dual antiplatelet therapy for one year. Risk factor modification.     09/2012 Echo Study Conclusions  - Left ventricle: Wall thickness was increased in a pattern of mild LVH with disproportionate septal thickening. Systolic function was normal. The estimated ejection fraction was in the range of 55% to 60%. Wall motion was normal; there were no regional wall motion abnormalities. - Aortic valve: Mildly calcified annulus. Trileaflet; mildly thickened leaflets. - Left atrium: The atrium was mildly dilated. - Atrial septum: No defect or patent foramen ovale was identified. - Pulmonary arteries: PA peak pressure: 66m Hg (S).    02/13/14 Clinic EKG NSR   09/2015 Nuclear stress test No diagnostic ST segment changes to indicate ischemia. No arrhythmias. Low risk Duke treadmill score of 5.5. No significant myocardial perfusion defects to indicate scar or ischemia. This is a low risk study. Nuclear stress EF: 70%.   Jan 2021 nuclear stress There was no ST segment deviation noted during stress. The study is normal. This is a low risk study. Nuclear stress EF: 70%.     Assessment and Plan   1. CAD - no symptoms - EKG today SR, no ischemic changes   2. HTN - at goal, continue current meds   3. Hyperlipidemia =-continue crestor, request labs from pcp  F/u 1 year     JArnoldo Lenis M.D.

## 2022-06-09 DIAGNOSIS — Z1231 Encounter for screening mammogram for malignant neoplasm of breast: Secondary | ICD-10-CM | POA: Diagnosis not present

## 2022-09-23 ENCOUNTER — Ambulatory Visit: Payer: 59 | Attending: Nurse Practitioner | Admitting: Nurse Practitioner

## 2022-09-23 ENCOUNTER — Encounter: Payer: Self-pay | Admitting: Nurse Practitioner

## 2022-09-23 ENCOUNTER — Other Ambulatory Visit (INDEPENDENT_AMBULATORY_CARE_PROVIDER_SITE_OTHER): Payer: 59

## 2022-09-23 ENCOUNTER — Encounter: Payer: Self-pay | Admitting: *Deleted

## 2022-09-23 VITALS — BP 116/62 | HR 64 | Ht 63.0 in | Wt 193.4 lb

## 2022-09-23 DIAGNOSIS — I2510 Atherosclerotic heart disease of native coronary artery without angina pectoris: Secondary | ICD-10-CM

## 2022-09-23 DIAGNOSIS — R42 Dizziness and giddiness: Secondary | ICD-10-CM | POA: Diagnosis not present

## 2022-09-23 DIAGNOSIS — E669 Obesity, unspecified: Secondary | ICD-10-CM | POA: Diagnosis not present

## 2022-09-23 DIAGNOSIS — R55 Syncope and collapse: Secondary | ICD-10-CM

## 2022-09-23 DIAGNOSIS — E785 Hyperlipidemia, unspecified: Secondary | ICD-10-CM

## 2022-09-23 DIAGNOSIS — I10 Essential (primary) hypertension: Secondary | ICD-10-CM

## 2022-09-23 NOTE — Progress Notes (Signed)
Office Visit    Patient Name: [name] Date of Encounter: 09/23/2022  PCP:  Estanislado Pandy, MD   [facility]  Cardiologist:  Dina Rich, MD *** Advanced Practice Provider:  No care team member to display Electrophysiologist:  None  {Press F2 to show EP APP, CHF, sleep or structural heart MD               :[id number]}  { Click here to update then REFRESH NOTE - MD (PCP) or APP (Team Member)  Change PCP Type for MD, Specialty for APP is either Cardiology or Clinical Cardiac Electrophysiology  :[id number]}  Chief Complaint    [name] is a 62 y.o. female with a hx of CAD, hyperlipidemia, hypertension, type 2 diabetes, hypothyroidism, obesity, and history of breast cancer, who presents today for scheduled follow-up.  Past Medical History    Past Medical History:  Diagnosis Date   Basal cell carcinoma 2013   Resected from left neck   Breast carcinoma ([facility])    Complication of anesthesia    Coronary artery disease    DES to LAD 2014   Diabetes mellitus type 2 with complications, uncontrolled    type 2  -- pills only   Endometriosis    TAH/BSO in 2003   Hyperlipidemia    Hypertension    Hypothyroidism    Obesity    PONV (postoperative nausea and vomiting)    severe--wants ? scop patch & drugs   Past Surgical History:  Procedure Laterality Date   ABDOMINAL HYSTERECTOMY     BREAST SURGERY     CARDIAC CATHETERIZATION     CHOLECYSTECTOMY N/A 06/28/2016   Procedure: LAPAROSCOPIC CHOLECYSTECTOMY WITH POSSIBLE INTRAOPERATIVE CHOLANGIOGRAM;  Surgeon: Almond Lint, MD;  [facility];  Service: General;  Laterality: N/A;   EYE SURGERY     cataract left eye   LEFT HEART CATHETERIZATION WITH CORONARY ANGIOGRAM N/A 10/05/2012   Procedure: LEFT HEART CATHETERIZATION WITH CORONARY ANGIOGRAM;  Surgeon: Peter M Swaziland, MD;  Location: [facility] CATH LAB;  Service: Cardiovascular;  Laterality: N/A;   MASTECTOMY     left   PERCUTANEOUS CORONARY STENT  INTERVENTION (PCI-S)  10/05/2012   Procedure: PERCUTANEOUS CORONARY STENT INTERVENTION (PCI-S);  Surgeon: Peter M Swaziland, MD;  Location[facility] CATH LAB;  Service: Cardiovascular;;   SKIN CANCER EXCISION  2013   Basal cell-left neck   TOTAL ABDOMINAL HYSTERECTOMY W/ BILATERAL SALPINGOOPHORECTOMY  2003    Allergies  Allergies  Allergen Reactions   Codeine Nausea And Vomiting    "deathly sick"   Demerol [Meperidine]     Pt states all pain meds make her sick on her stomach   Dilaudid [Hydromorphone Hcl] Nausea And Vomiting   Morphine And Related Nausea And Vomiting   Atorvastatin Nausea And Vomiting    History of Present Illness    [name] is a 62 y.o. female with a PMH as mentioned above.  Previous cardiovascular history includes prior drug-eluting stent to LAD in 2014.  NST in 2017 that was without ischemia, low risk.  Repeat NST in 2021 was negative for ischemia.  Last seen by Dr. Dina Rich on February 14, 2022.  Overall she was doing well from a cardiac perspective.  Today she presents for follow-up.  She states  SH: Works at [employer] Studies Reviewed:   The following studies were reviewed today: ***  EKG:  EKG is *** ordered today.  The ekg ordered today demonstrates ***  Recent Labs: No results found for requested labs within last 365 days.  Recent Lipid Panel    Component Value Date/Time   CHOL 272 (H) 10/03/2012 1018   TRIG 179 (H) 10/03/2012 1018   HDL 50 10/03/2012 1018   CHOLHDL 5.4 10/03/2012 1018   VLDL 36 10/03/2012 1018   LDLCALC 186 (H) 10/03/2012 1018    Risk Assessment/Calculations:  {Does this patient have ATRIAL FIBRILLATION?:[phone]}  Home Medications   No outpatient medications have been marked as taking for the 09/23/22 encounter (Appointment) with Sharlene Dory, NP.     Review of Systems   ***   All other systems reviewed and are otherwise negative except as noted above.  Physical Exam    VS:  There  were no vitals taken for this visit. , BMI There is no height or weight on file to calculate BMI.  Wt Readings from Last 3 Encounters:  02/14/22 194 lb (88 kg)  08/06/20 206 lb (93.4 kg)  05/21/19 193 lb (87.5 kg)     GEN: Well nourished, well developed, in no acute distress. HEENT: normal. Neck: Supple, no JVD, carotid bruits, or masses. Cardiac: ***RRR, no murmurs, rubs, or gallops. No clubbing, cyanosis, edema.  ***Radials/PT 2+ and equal bilaterally.  Respiratory:  ***Respirations regular and unlabored, clear to auscultation bilaterally. GI: Soft, nontender, nondistended. MS: No deformity or atrophy. Skin: Warm and dry, no rash. Neuro:  Strength and sensation are intact. Psych: Normal affect.  Assessment & Plan    ***  {Are you ordering a CV Procedure (e.g. stress test, cath, DCCV, TEE, etc)?   Press F2        :[id number]}      Disposition: Follow up {follow up:15908} with Dina Rich, MD or APP.  Signed, Sharlene Dory, NP 09/23/2022, 8:24 AM [facility]

## 2022-09-23 NOTE — Patient Instructions (Addendum)
Medication Instructions:  Your physician recommends that you continue on your current medications as directed. Please refer to the Current Medication list given to you today.   *If you need a refill on your cardiac medications before your next appointment, please call your pharmacy*   Lab Work: Your physician recommends that you return for lab work at [facility]   If you have labs (blood work) drawn today and your tests are completely normal, you will receive your results only by: [employer] (if you have MyChart) OR A paper copy in the mail If you have any lab test that is abnormal or we need to change your treatment, we will call you to review the results.   Testing/Procedures: Your physician has requested that you have an echocardiogram. Echocardiography is a painless test that uses sound waves to create images of your heart. It provides your doctor with information about the size and shape of your heart and how well your heart's chambers and valves are working. This procedure takes approximately one hour. There are no restrictions for this procedure. Please do NOT wear cologne, perfume, aftershave, or lotions (deodorant is allowed). Please arrive 15 minutes prior to your appointment time.  Your physician has requested that you have a carotid duplex. This test is an ultrasound of the carotid arteries in your neck. It looks at blood flow through these arteries that supply the brain with blood. Allow one hour for this exam. There are no restrictions or special instructions.   ZIO XT- Long Term Monitor Instructions   Your physician has requested you wear your ZIO patch monitor____14___days.   This is a single patch monitor.  [employer] supplies one patch monitor per enrollment.  Additional stickers are not available.   Please do not apply patch if you will be having a Nuclear Stress Test, Echocardiogram, Cardiac CT, MRI, or Chest Xray during the time frame you would be wearing  the monitor. The patch cannot be worn during these tests.  You cannot remove and re-apply the ZIO XT patch monitor.   Your ZIO patch monitor will be sent USPS Priority mail from [facility] directly to your home address. The monitor may also be mailed to a PO BOX if home delivery is not available.   It may take 3-5 days to receive your monitor after you have been enrolled.   Once you have received you monitor, please review enclosed instructions.  Your monitor has already been registered assigning a specific monitor serial # to you.   Applying the monitor   Shave hair from upper left chest.   Hold abrader disc by orange tab.  Rub abrader in 40 strokes over left upper chest as indicated in your monitor instructions.   Clean area with 4 enclosed alcohol pads .  Use all pads to assure are is cleaned thoroughly.  Let dry.   Apply patch as indicated in monitor instructions.  Patch will be place under collarbone on left side of chest with arrow pointing upward.   Rub patch adhesive wings for 2 minutes.Remove white label marked "1".  Remove white label marked "2".  Rub patch adhesive wings for 2 additional minutes.   While looking in a mirror, press and release button in center of patch.  A small green light will flash 3-4 times .  This will be your only indicator the monitor has been turned on.     Do not shower for the first 24 hours.  You may shower after the  first 24 hours.   Press button if you feel a symptom. You will hear a small click.  Record Date, Time and Symptom in the Patient Log Book.   When you are ready to remove patch, follow instructions on last 2 pages of Patient Log Book.  Stick patch monitor onto last page of Patient Log Book.   Place Patient Log Book in [place] box.  Use locking tab on box and tape box closed securely.  The [employer] and [employer] has [employer] on it.  Please place in mailbox as soon as possible.  Your physician should have your test results  approximately 7 days after the monitor has been mailed back to [facility].   Call [facility] Customer Care at [phone] if you have questions regarding your ZIO XT patch monitor.  Call them immediately if you see an orange light blinking on your monitor.   If your monitor falls off in less than 4 days contact our Monitor department at [phone].  If your monitor becomes loose or falls off after 4 days call [employer] at [phone] for suggestions on securing your monitor.     Follow-Up: At [facility], you and your health needs are our priority.  As part of our continuing mission to provide you with exceptional heart care, we have created designated Provider Care Teams.  These Care Teams include your primary Cardiologist (physician) and Advanced Practice Providers (APPs -  Physician Assistants and Nurse Practitioners) who all work together to provide you with the care you need, when you need it.  We recommend signing up for the patient portal called "MyChart".  Sign up information is provided on this After Visit Summary.  MyChart is used to connect with patients for Virtual Visits (Telemedicine).  Patients are able to view lab/test results, encounter notes, upcoming appointments, etc.  Non-urgent messages can be sent to your provider as well.   To learn more about what you can do with MyChart, go to [email or web address].    Your next appointment:   6-8 week(s)  Provider:   Sharlene Dory, NP    Other Instructions Thank you for choosing [facility]!  Blood Pressure Record Sheet To take your blood pressure, you will need a blood pressure machine. You may be prescribed one, or you can buy a blood pressure machine (blood pressure monitor) at your clinic, drug store, or online. When choosing one, look for these features: An automatic monitor that has an arm cuff. A cuff that wraps snugly, but not too tightly, around your upper arm. You should be able to fit  only one finger between your arm and the cuff. A device that stores blood pressure reading results. Do not choose a monitor that measures your blood pressure from your wrist or finger. Follow your health care provider's instructions for how to take your blood pressure. To use this form: Get one reading in the morning (a.m.) before you take any medicines. Get one reading in the evening (p.m.) before supper. Take at least two readings with each blood pressure check. This makes sure the results are correct. Wait 1-2 minutes between measurements. Write down the results in the spaces on this form. Repeat this once a week, or as told by your health care provider. Make a follow-up appointment with your health care provider to discuss the results. Blood pressure log Date: _______________________ a.m. _____________________(1st reading) _____________________(2nd reading) p.m. _____________________(1st reading) _____________________(2nd reading) Date: _______________________ a.m. _____________________(1st reading) _____________________(2nd reading) p.m. _____________________(1st reading) _____________________(2nd reading)  Date: _______________________ a.m. _____________________(1st reading) _____________________(2nd reading) p.m. _____________________(1st reading) _____________________(2nd reading) Date: _______________________ a.m. _____________________(1st reading) _____________________(2nd reading) p.m. _____________________(1st reading) _____________________(2nd reading) Date: _______________________ a.m. _____________________(1st reading) _____________________(2nd reading) p.m. _____________________(1st reading) _____________________(2nd reading) This information is not intended to replace advice given to you by your health care provider. Make sure you discuss any questions you have with your health care provider. Document Revised: 01/14/2021 Document Reviewed: 01/14/2021 Elsevier Patient  Education  2023 [employer].

## 2022-09-26 ENCOUNTER — Other Ambulatory Visit (HOSPITAL_COMMUNITY)
Admission: RE | Admit: 2022-09-26 | Discharge: 2022-09-26 | Disposition: A | Discharge status: Home / Self Care | Payer: 59 | Source: Ambulatory Visit | Attending: Nurse Practitioner | Admitting: Nurse Practitioner

## 2022-09-26 DIAGNOSIS — R55 Syncope and collapse: Secondary | ICD-10-CM

## 2022-09-26 LAB — CBC
HCT: 40.7 % (ref 36.0–46.0)
Hemoglobin: 13.2 g/dL (ref 12.0–15.0)
MCH: 30.5 pg (ref 26.0–34.0)
MCHC: 32.4 g/dL (ref 30.0–36.0)
MCV: 94 fL (ref 80.0–100.0)
Platelets: 201 10*3/uL (ref 150–400)
RBC: 4.33 MIL/uL (ref 3.87–5.11)
RDW: 12.7 % (ref 11.5–15.5)
WBC: 8.3 10*3/uL (ref 4.0–10.5)
nRBC: 0 % (ref 0.0–0.2)

## 2022-09-26 LAB — BASIC METABOLIC PANEL
Anion gap: 9 (ref 5–15)
BUN: 18 mg/dL (ref 8–23)
CO2: 26 mmol/L (ref 22–32)
Calcium: 9.1 mg/dL (ref 8.9–10.3)
Chloride: 101 mmol/L (ref 98–111)
Creatinine, Ser: 0.82 mg/dL (ref 0.44–1.00)
GFR, Estimated: >60 mL/min (ref >60)
Glucose, Bld: 140 mg/dL — ABNORMAL HIGH (ref 70–99)
Potassium: 4.2 mmol/L (ref 3.5–5.1)
Sodium: 136 mmol/L (ref 135–145)

## 2022-09-26 LAB — MAGNESIUM: Magnesium: 1.8 mg/dL (ref 1.7–2.4)

## 2022-09-27 ENCOUNTER — Ambulatory Visit (HOSPITAL_COMMUNITY): Payer: 59

## 2022-09-27 DIAGNOSIS — R55 Syncope and collapse: Secondary | ICD-10-CM | POA: Diagnosis not present

## 2022-09-28 LAB — THYROID PANEL WITH TSH
Free Thyroxine Index: 2.4 (ref 1.2–4.9)
T3 Uptake Ratio: 30 % (ref 24–39)
T4, Total: 8 ug/dL (ref 4.5–12.0)
TSH: 0.459 u[IU]/mL (ref 0.450–4.500)

## 2022-10-06 ENCOUNTER — Ambulatory Visit (HOSPITAL_COMMUNITY)
Admission: RE | Admit: 2022-10-06 | Discharge: 2022-10-06 | Disposition: A | Discharge status: Home / Self Care | Payer: 59 | Source: Ambulatory Visit | Attending: Nurse Practitioner | Admitting: Nurse Practitioner

## 2022-10-06 DIAGNOSIS — R55 Syncope and collapse: Secondary | ICD-10-CM | POA: Diagnosis not present

## 2022-10-06 DIAGNOSIS — I6523 Occlusion and stenosis of bilateral carotid arteries: Secondary | ICD-10-CM | POA: Diagnosis not present

## 2022-10-12 ENCOUNTER — Telehealth: Payer: Self-pay | Admitting: Cardiology

## 2022-10-12 NOTE — Telephone Encounter (Signed)
Pt returning nurses call regarding results. Please advise 

## 2022-10-12 NOTE — Telephone Encounter (Signed)
I discussed normal carotid US results with patient,copied pcp.

## 2022-11-04 ENCOUNTER — Ambulatory Visit (HOSPITAL_COMMUNITY)
Admission: RE | Admit: 2022-11-04 | Discharge: 2022-11-04 | Disposition: A | Discharge status: Home / Self Care | Payer: 59 | Source: Ambulatory Visit | Attending: Nurse Practitioner | Admitting: Nurse Practitioner

## 2022-11-04 DIAGNOSIS — R55 Syncope and collapse: Secondary | ICD-10-CM | POA: Diagnosis not present

## 2022-11-04 LAB — ECHOCARDIOGRAM COMPLETE
AR max vel: 1.53 cm2
AV Area VTI: 1.74 cm2
AV Area mean vel: 1.67 cm2
AV Mean grad: 6 mmHg
AV Peak grad: 11.2 mmHg
Ao pk vel: 1.67 m/s
Area-P 1/2: 3.08 cm2
P 1/2 time: 417 msec
S' Lateral: 2.6 cm

## 2022-11-04 NOTE — Progress Notes (Signed)
*  PRELIMINARY RESULTS* Echocardiogram 2D Echocardiogram has been performed.  Stacey Drain 11/04/2022, 10:10 AM

## 2022-11-10 ENCOUNTER — Ambulatory Visit: Payer: 59 | Attending: Nurse Practitioner | Admitting: Nurse Practitioner

## 2022-11-10 ENCOUNTER — Encounter: Payer: Self-pay | Admitting: Nurse Practitioner

## 2022-11-10 VITALS — BP 114/60 | HR 73 | Ht 64.0 in | Wt 198.3 lb

## 2022-11-10 DIAGNOSIS — I471 Supraventricular tachycardia, unspecified: Secondary | ICD-10-CM

## 2022-11-10 DIAGNOSIS — I10 Essential (primary) hypertension: Secondary | ICD-10-CM

## 2022-11-10 DIAGNOSIS — E785 Hyperlipidemia, unspecified: Secondary | ICD-10-CM

## 2022-11-10 DIAGNOSIS — R55 Syncope and collapse: Secondary | ICD-10-CM | POA: Diagnosis not present

## 2022-11-10 DIAGNOSIS — I2510 Atherosclerotic heart disease of native coronary artery without angina pectoris: Secondary | ICD-10-CM

## 2022-11-10 DIAGNOSIS — E669 Obesity, unspecified: Secondary | ICD-10-CM | POA: Diagnosis not present

## 2022-11-10 NOTE — Progress Notes (Signed)
Cardiology Office Note:  .   Date:  11/10/2022  ID:  [name], DOB Sep 30, 1960, MRN [id number] PCP: Estanislado Pandy, MD  [facility] Providers Cardiologist:  Dina Rich, MD    History of Present Illness: .   [name] is a 62 y.o. female with a PMH of CAD, hyperlipidemia, hypertension, type 2 diabetes, hypothyroidism, obesity, and history of breast cancer, who presents today for scheduled follow-up.  Previous CV history includes DES to LAD in 2014, NST in 2017 negative for ischemia, low risk.  Repeat NST in 2021 negative.  Last seen by me on Sep 23, 2022.  Noted recent spells of near syncope, feeling lightheaded. Denied any dizziness, vertigo, nausea, LOC, or syncope.  Stated she has been eating pretty well, staying well-hydrated.  Denied any prodromal symptoms. Denied any chest pain, shortness of breath, palpitations, syncope, dizziness, orthopnea, PND, swelling or significant weight changes, acute bleeding, or claudication.  Workup arranged-see below.  Results unremarkable.  Today she presents for follow-up.  She states she is doing well. Denies any recurrences in near syncopal episodes. Denies any chest pain, shortness of breath, palpitations, syncope, presyncope, dizziness, orthopnea, PND, swelling or significant weight changes, acute bleeding, or claudication.  SH: Works at [employer]  Studies Reviewed: [name]      EKG today shows normal sinus rhythm, 69 bpm.   Echocardiogram 10/2022: 1. Left ventricular ejection fraction, by estimation, is 60 to 65%. The  left ventricle has normal function. The left ventricle has no regional  wall motion abnormalities. There is mild asymmetric left ventricular  hypertrophy of the basal segment. Left  ventricular diastolic parameters were normal.   2. Right ventricular systolic function is normal. The right ventricular  size is normal. Tricuspid regurgitation signal is inadequate for assessing  PA pressure.   3. The mitral valve  is grossly normal. Trivial mitral valve  regurgitation.   4. The aortic valve is tricuspid. There is mild calcification of the  aortic valve. Aortic valve regurgitation is mild. Aortic valve  sclerosis/calcification is present, without any evidence of aortic  stenosis. Aortic valve mean gradient measures 6.0  mmHg.   5. The inferior vena cava is normal in size with greater than 50%  respiratory variability, suggesting right atrial pressure of 3 mmHg.   Comparison(s): Prior images unable to be directly viewed  Cardiac monitor 10/2022:   14 day monitor   Rare supraventricular ectopy in the form of PACs, triplets. Four runs of SVT the longest 10.2 seconds.   Rare ventricular ectopy in the form of isolated PVCs   Symptoms correlated with normal sinus rhythm     Patch Wear Time:  14 days and 0 hours (2024-05-10T11:11:08-398 to 2024-05-24T11:11:08-398)   Patient had a min HR of 55 bpm, max HR of 200 bpm, and avg HR of 76 bpm. Predominant underlying rhythm was Sinus Rhythm. 4 Supraventricular Tachycardia runs occurred, the run with the fastest interval lasting 9.8 secs with a max rate of 200 bpm, the  longest lasting 10.2 secs with an avg rate of 131 bpm. Isolated SVEs were rare (<1.0%), SVE Triplets were rare (<1.0%), and no SVE Couplets were present. Isolated VEs were rare (<1.0%), and no VE Couplets or VE Triplets were present.   Physical Exam:   VS:  BP 114/60   Pulse 73   Ht 5\' 4"  (1.626 m)   Wt 198 lb 4.8 oz (89.9 kg)   SpO2 95%   BMI 34.04 kg/m  Wt Readings from Last 3 Encounters:  11/10/22 198 lb 4.8 oz (89.9 kg)  09/23/22 193 lb 6.4 oz (87.7 kg)  02/14/22 194 lb (88 kg)    GEN: Obese, 62 y.o. female in no acute distress NECK: No JVD; No carotid bruits CARDIAC: S1/S2, RRR, no murmurs, rubs, gallops RESPIRATORY:  Clear to auscultation without rales, wheezing or rhonchi  ABDOMEN: Soft, non-tender, non-distended EXTREMITIES:  No edema; No deformity   ASSESSMENT AND PLAN:  .    Hx of near syncope, PSVT Etiology unclear. No more recurrences. Denies any LOC.  Monitor revealed normal sinus rhythm, 2 brief episodes of what appears to be PSVT, was not symptomatic.  Echocardiogram benign.  Carotid Doppler normal.  Care and ED precautions discussed. No medication changes at this time.    CAD, HLD Stable with no anginal symptoms. No indication for ischemic evaluation. Continue current medication regimen. Heart healthy diet and regular cardiovascular exercise encouraged.    HTN BP stable. Continue lisinopril and metoprolol.  Previously given a BP log and discussed to monitor BP at home at least 2 hours after medications and sitting for 5-10 minutes. Heart healthy diet and regular cardiovascular exercise encouraged.    Obesity  Weight loss via diet and exercise encouraged. Discussed the impact being overweight would have on cardiovascular risk.  Dispo: Follow-up with Dr. Dina Rich or APP in 6 months or sooner if anything changes.    Signed, Sharlene Dory, NP

## 2022-11-10 NOTE — Patient Instructions (Addendum)
Medication Instructions:  Your physician recommends that you continue on your current medications as directed. Please refer to the Current Medication list given to you today.  Labwork: none  Testing/Procedures: none  Follow-Up: Your physician recommends that you schedule a follow-up appointment in: 6 Months with Philis Nettle or [employer]  Any Other Special Instructions Will Be Listed Below (If Applicable).  If you need a refill on your cardiac medications before your next appointment, please call your pharmacy.

## 2023-03-31 DIAGNOSIS — F3342 Major depressive disorder, recurrent, in full remission: Secondary | ICD-10-CM | POA: Diagnosis not present

## 2023-05-12 ENCOUNTER — Ambulatory Visit: Payer: 59 | Attending: Nurse Practitioner | Admitting: Nurse Practitioner

## 2023-05-12 VITALS — BP 124/78 | HR 75 | Ht 64.0 in | Wt 196.2 lb

## 2023-05-12 DIAGNOSIS — I2510 Atherosclerotic heart disease of native coronary artery without angina pectoris: Secondary | ICD-10-CM | POA: Diagnosis not present

## 2023-05-12 DIAGNOSIS — E669 Obesity, unspecified: Secondary | ICD-10-CM | POA: Diagnosis not present

## 2023-05-12 DIAGNOSIS — R55 Syncope and collapse: Secondary | ICD-10-CM | POA: Diagnosis not present

## 2023-05-12 DIAGNOSIS — I10 Essential (primary) hypertension: Secondary | ICD-10-CM | POA: Diagnosis not present

## 2023-05-12 DIAGNOSIS — E785 Hyperlipidemia, unspecified: Secondary | ICD-10-CM | POA: Diagnosis not present

## 2023-05-12 DIAGNOSIS — I471 Supraventricular tachycardia, unspecified: Secondary | ICD-10-CM

## 2023-05-12 NOTE — Progress Notes (Signed)
Cardiology Office Note:  .   Date:  05/12/2023  ID:  DEZLYN Hansen, DOB 1960/11/08, MRN [id number] PCP: Teresa Pandy, MD  [facility] Providers Cardiologist:  Dina Rich, MD    History of Present Illness: .   [name] is a 62 y.o. female with a PMH of CAD, hyperlipidemia, hypertension, type 2 diabetes, hypothyroidism, obesity, and history of breast cancer, who presents today for scheduled follow-up.  Previous CV history includes DES to LAD in 2014, NST in 2017 negative for ischemia, low risk.  Repeat NST in 2021 negative.  Last seen by me on November 10, 2022.  She was doing well and denied any recurrences in near syncopal episodes. Denied any chest pain, shortness of breath, palpitations, syncope, presyncope, dizziness, orthopnea, PND, swelling or significant weight changes, acute bleeding, or claudication.  Today she presents for follow-up.  She states she is doing well. Denies any recurrences in near syncopal episodes. Also denies any chest pain, shortness of breath, palpitations, syncope, presyncope, dizziness, orthopnea, PND, swelling or significant weight changes, acute bleeding, or claudication.   SH: Works at [employer]  Studies Reviewed: [name]      EKG is not ordered today.   Echocardiogram 10/2022: 1. Left ventricular ejection fraction, by estimation, is 60 to 65%. The  left ventricle has normal function. The left ventricle has no regional  wall motion abnormalities. There is mild asymmetric left ventricular  hypertrophy of the basal segment. Left  ventricular diastolic parameters were normal.   2. Right ventricular systolic function is normal. The right ventricular  size is normal. Tricuspid regurgitation signal is inadequate for assessing  PA pressure.   3. The mitral valve is grossly normal. Trivial mitral valve  regurgitation.   4. The aortic valve is tricuspid. There is mild calcification of the  aortic valve. Aortic valve regurgitation is mild. Aortic  valve  sclerosis/calcification is present, without any evidence of aortic  stenosis. Aortic valve mean gradient measures 6.0  mmHg.   5. The inferior vena cava is normal in size with greater than 50%  respiratory variability, suggesting right atrial pressure of 3 mmHg.   Comparison(s): Prior images unable to be directly viewed  Cardiac monitor 10/2022:   14 day monitor   Rare supraventricular ectopy in the form of PACs, triplets. Four runs of SVT the longest 10.2 seconds.   Rare ventricular ectopy in the form of isolated PVCs   Symptoms correlated with normal sinus rhythm     Patch Wear Time:  14 days and 0 hours (2024-05-10T11:11:08-398 to 2024-05-24T11:11:08-398)   Patient had a min HR of 55 bpm, max HR of 200 bpm, and avg HR of 76 bpm. Predominant underlying rhythm was Sinus Rhythm. 4 Supraventricular Tachycardia runs occurred, the run with the fastest interval lasting 9.8 secs with a max rate of 200 bpm, the  longest lasting 10.2 secs with an avg rate of 131 bpm. Isolated SVEs were rare (<1.0%), SVE Triplets were rare (<1.0%), and no SVE Couplets were present. Isolated VEs were rare (<1.0%), and no VE Couplets or VE Triplets were present.   Physical Exam:   VS:  BP 124/78 (BP Location: Right Arm, Cuff Size: Normal)   Pulse 75   Ht 5\' 4"  (1.626 m)   Wt 196 lb 3.2 oz (89 kg)   SpO2 95%   BMI 33.68 kg/m    Wt Readings from Last 3 Encounters:  05/12/23 196 lb 3.2 oz (89 kg)  11/10/22 198 lb 4.8  oz (89.9 kg)  09/23/22 193 lb 6.4 oz (87.7 kg)    GEN: Obese, 62 y.o. female in no acute distress NECK: No JVD; No carotid bruits CARDIAC: S1/S2, RRR, no murmurs, rubs, gallops RESPIRATORY:  Clear to auscultation without rales, wheezing or rhonchi  ABDOMEN: Soft, non-tender, non-distended EXTREMITIES:  No edema; No deformity   ASSESSMENT AND PLAN: .    Hx of near syncope, PSVT Etiology unclear. No recurrences since last office visit. Denies any LOC.  Past monitor revealed normal  sinus rhythm, 2 brief episodes of what appears to be PSVT, was not symptomatic.  Echocardiogram benign.  Carotid Doppler normal.  Care and ED precautions discussed. No medication changes at this time.    CAD, HLD Stable with no anginal symptoms. No indication for ischemic evaluation. Continue current medication regimen. Heart healthy diet and regular cardiovascular exercise encouraged.    HTN BP stable. Continue lisinopril and metoprolol.  Discussed to monitor BP at home at least 2 hours after medications and sitting for 5-10 minutes. Heart healthy diet and regular cardiovascular exercise encouraged.    Obesity  Weight loss via diet and exercise encouraged. Discussed the impact being overweight would have on cardiovascular risk.  Dispo: She will be having upcoming labs drawn with Dr. Neita Carp, and I have requested that these are faxed to [place] once results are available. She verbalized understanding. Follow-up with Dr. Dina Rich or APP in 1 year or sooner if anything changes.    Signed, Sharlene Dory, NP

## 2023-05-12 NOTE — Patient Instructions (Signed)

## 2023-11-13 ENCOUNTER — Other Ambulatory Visit: Payer: Self-pay | Admitting: *Deleted

## 2023-11-13 DIAGNOSIS — Z1211 Encounter for screening for malignant neoplasm of colon: Secondary | ICD-10-CM

## 2023-11-14 ENCOUNTER — Ambulatory Visit (INDEPENDENT_AMBULATORY_CARE_PROVIDER_SITE_OTHER): Admitting: Surgery

## 2023-11-14 ENCOUNTER — Encounter: Payer: Self-pay | Admitting: Surgery

## 2023-11-14 VITALS — BP 114/71 | HR 75 | Temp 98.0°F | Resp 14 | Ht 64.0 in | Wt 193.0 lb

## 2023-11-14 DIAGNOSIS — Z1211 Encounter for screening for malignant neoplasm of colon: Secondary | ICD-10-CM | POA: Diagnosis not present

## 2023-11-14 MED ORDER — SUTAB 1479-225-188 MG PO TABS: ORAL_TABLET | ORAL | 0 refills | Status: AC

## 2023-11-14 NOTE — H&P (Signed)
 [facility] History and Physical   Reason for Referral: Screening for colon cancer Referring Physician: Dr. Lari   Chief Complaint   New Patient (Initial Visit); Colonoscopy        [name] is a 63 y.o. female.  HPI: Patient presents to discuss a screening colonoscopy.  She had a colonoscopy 30 years ago at some point in the 90s around the time she was diagnosed with breast cancer.  There were no abnormalities noted on that colonoscopy.  She has not had a colonoscopy since then.  She currently denies any symptoms of abdominal pain, hematochezia, or melena.  She denies nausea and vomiting.  She is having regular bowel movements.  She has no family history of colorectal cancer.  Her father was diagnosed with stomach cancer.  Her past medical history is significant for left breast cancer status postmastectomy and chemotherapy in the 90s, heart attack with stent placement in the early 2000's, diabetes, hypertension, hyperlipidemia, and hypothyroidism.  She denies use of blood thinning medications.  Her surgical history is significant for left breast mastectomy, laparoscopic cholecystectomy, and total hysterectomy with bilateral salpingo-oophorectomy.  She will occasionally consume alcohol.  She denies use of tobacco products and illegal drugs.  She is currently on Trulicity and [place].       Past Medical History:  Diagnosis Date   Basal cell carcinoma 2013    Resected from left neck   Breast carcinoma (HCC)     Complication of anesthesia     Coronary artery disease      DES to LAD 2014   Diabetes mellitus type 2 with complications, uncontrolled      type 2  -- pills only   Endometriosis      TAH/BSO in 2003   Hyperlipidemia     Hypertension     Hypothyroidism     Obesity     PONV (postoperative nausea and vomiting)      severe--wants ? scop patch & drugs               Past Surgical History:  Procedure Laterality Date   ABDOMINAL HYSTERECTOMY        BREAST SURGERY       CARDIAC CATHETERIZATION       CHOLECYSTECTOMY N/A 06/28/2016    Procedure: LAPAROSCOPIC CHOLECYSTECTOMY WITH POSSIBLE INTRAOPERATIVE CHOLANGIOGRAM;  Surgeon: Jina Nephew, MD;  Location: [facility];  Service: General;  Laterality: N/A;   EYE SURGERY        cataract left eye   LEFT HEART CATHETERIZATION WITH CORONARY ANGIOGRAM N/A 10/05/2012    Procedure: LEFT HEART CATHETERIZATION WITH CORONARY ANGIOGRAM;  Surgeon: Peter M Swaziland, MD;  Location: [facility] CATH LAB;  Service: Cardiovascular;  Laterality: N/A;   MASTECTOMY        left   PERCUTANEOUS CORONARY STENT INTERVENTION (PCI-S)   10/05/2012    Procedure: PERCUTANEOUS CORONARY STENT INTERVENTION (PCI-S);  Surgeon: Peter M Swaziland, MD;  Location: [facility] CATH LAB;  Service: Cardiovascular;;   SKIN CANCER EXCISION   2013    Basal cell-left neck   TOTAL ABDOMINAL HYSTERECTOMY W/ BILATERAL SALPINGOOPHORECTOMY   2003               Family History  Problem Relation Age of Onset   Hypertension Mother     Heart disease Father            Social History  Social History         Tobacco Use   Smoking status:  Former      Current packs/day: 0.00      Average packs/day: 1.5 packs/day for 14.3 years (21.5 ttl pk-yrs)      Types: Cigarettes      Start date: 05/17/1975      Quit date: 09/03/1989      Years since quitting: 34.2   Smokeless tobacco: Never  Vaping Use   Vaping status: Never Used  Substance Use Topics   Alcohol use: Yes      Alcohol/week: 0.0 standard drinks of alcohol      Comment: occasional   Drug use: No        Medications: I have reviewed the patient's current medications. Allergies as of 11/14/2023         Reactions    Codeine Nausea And Vomiting    deathly sick    Demerol [meperidine]      Pt states all pain meds make her sick on her stomach    Dilaudid [hydromorphone Hcl] Nausea And Vomiting    Morphine And Codeine Nausea And Vomiting    Atorvastatin  Nausea And Vomiting            Medication List            Accurate as of November 14, 2023 10:16 AM. If you have any questions, ask your nurse or doctor.              STOP taking these medications     Victoza 18 MG/3ML Sopn Generic drug: liraglutide Stopped by: Noah Lembke A Alayiah Fontes           TAKE these medications     acetaminophen  500 MG tablet Commonly known as: TYLENOL  Take 1,000 mg by mouth every 6 (six) hours as needed (for pain.).    aspirin  EC 81 MG tablet Take 81 mg by mouth daily.    citalopram  40 MG tablet Commonly known as: CELEXA  Take 60 mg by mouth daily.    ibuprofen  200 MG tablet Commonly known as: ADVIL  Take 3 tablets (600 mg total) by mouth every 6 (six) hours as needed for mild pain, moderate pain or cramping (for pain.).    Jardiance 25 MG Tabs tablet Generic drug: empagliflozin TK 1 T PO D    levothyroxine  100 MCG tablet Commonly known as: SYNTHROID  TK 1 T PO D    lisinopril  10 MG tablet Commonly known as: ZESTRIL  Take 10 mg by mouth daily.    metFORMIN 500 MG tablet Commonly known as: GLUCOPHAGE Take 250-1,000 mg by mouth in the morning and at bedtime. (250 mg in the morning & 1000 mg in the evening) What changed: how much to take    metoprolol  tartrate 25 MG tablet Commonly known as: LOPRESSOR  TAKE 1/2 TABLET(12.5 MG) BY MOUTH TWICE DAILY    nitroGLYCERIN  0.4 MG SL tablet Commonly known as: NITROSTAT  Place 1 tablet (0.4 mg total) under the tongue every 5 (five) minutes as needed for chest pain.    rosuvastatin  5 MG tablet Commonly known as: CRESTOR  TAKE 1 TABLET BY MOUTH EVERY DAY    Trulicity 1.5 MG/0.5ML Soaj Generic drug: Dulaglutide Inject 1.5 mg into the skin once a week.               ROS:  Constitutional: negative for chills, fatigue, and fevers Eyes: negative for visual disturbance and pain Ears, nose, mouth, throat, and face: positive for sore throat, negative for ear drainage and sinus problems Respiratory: negative for cough, wheezing, and shortness of  breath Cardiovascular: negative  for chest pain and palpitations Gastrointestinal: negative for abdominal pain, nausea, reflux symptoms, and vomiting Genitourinary:negative for dysuria and frequency Integument/breast: negative for dryness and rash Hematologic/lymphatic: negative for bleeding and lymphadenopathy Musculoskeletal:positive for neck pain, negative for back pain Neurological: negative for dizziness and tremors Endocrine: negative for temperature intolerance   Blood pressure 114/71, pulse 75, temperature 98 F (36.7 C), temperature source Oral, resp. rate 14, height 5' 4 (1.626 m), weight 193 lb (87.5 kg), SpO2 94%. Physical Exam Vitals reviewed.  Constitutional:      Appearance: Normal appearance.  HENT:     Head: Normocephalic and atraumatic.    Eyes:     Extraocular Movements: Extraocular movements intact.     Pupils: Pupils are equal, round, and reactive to light.      Cardiovascular:     Rate and Rhythm: Normal rate and regular rhythm.  Pulmonary:     Effort: Pulmonary effort is normal.     Breath sounds: Normal breath sounds.  Abdominal:     Comments: Abdomen soft, nondistended, no percussion tenderness, nontender to palpation; no rigidity, guarding, rebound tenderness    Musculoskeletal:        General: Normal range of motion.     Cervical back: Normal range of motion.    Skin:    General: Skin is warm and dry.    Neurological:     General: No focal deficit present.     Mental Status: She is alert and oriented to person, place, and time.    Psychiatric:        Mood and Affect: Mood normal.        Behavior: Behavior normal.        Results: Lab Results Last 48 Hours  No results found for this or any previous visit (from the past 48 hours).     Imaging Results (Last 48 hours)  No results found.       Assessment & Plan:  [name] is a 63 y.o. female who presents to discuss colonoscopy.   -We discussed methods for screening for colon  cancer, and the role colonoscopy plays in screening for colon cancer -The risk and benefits of colonoscopy were discussed including but not limited to bleeding, infection, missed lesions, and perforation of the colon requiring surgery.  After careful consideration, Omah Dewalt has decided to proceed with colonoscopy.  -Patient tentatively scheduled for colonoscopy on 7/22 -Prescription provided to the patient for Sutab prep, as well as an instruction sheet on how to appropriately administer the prep. -Coupon also provided to the patient for Sutab prep -Information provided to the patient regarding colonoscopy and screening for colon cancer -Advised that she will need to hold her Trulicity for 8 days and her Jardiance for 3 days     All questions were answered to the satisfaction of the patient and family.   Note: Portions of this report may have been transcribed using voice recognition software. Every effort has been made to ensure accuracy; however, inadvertent computerized transcription errors may still be present.    Dorothyann Brittle, DO [facility] [address] [place], [place] [address] [phone] (office)

## 2023-11-14 NOTE — Progress Notes (Signed)
 [facility] History and Physical  Reason for Referral: Screening for colon cancer Referring Physician: Dr. Lari  Chief Complaint   New Patient (Initial Visit); Colonoscopy     [name] is a 63 y.o. female.  HPI: Patient presents to discuss a screening colonoscopy.  She had a colonoscopy 30 years ago at some point in the 90s around the time she was diagnosed with breast cancer.  There were no abnormalities noted on that colonoscopy.  She has not had a colonoscopy since then.  She currently denies any symptoms of abdominal pain, hematochezia, or melena.  She denies nausea and vomiting.  She is having regular bowel movements.  She has no family history of colorectal cancer.  Her father was diagnosed with stomach cancer.  Her past medical history is significant for left breast cancer status postmastectomy and chemotherapy in the 90s, heart attack with stent placement in the early 2000's, diabetes, hypertension, hyperlipidemia, and hypothyroidism.  She denies use of blood thinning medications.  Her surgical history is significant for left breast mastectomy, laparoscopic cholecystectomy, and total hysterectomy with bilateral salpingo-oophorectomy.  She will occasionally consume alcohol.  She denies use of tobacco products and illegal drugs.  She is currently on Trulicity and [place].  Past Medical History:  Diagnosis Date   Basal cell carcinoma 2013   Resected from left neck   Breast carcinoma (HCC)    Complication of anesthesia    Coronary artery disease    DES to LAD 2014   Diabetes mellitus type 2 with complications, uncontrolled    type 2  -- pills only   Endometriosis    TAH/BSO in 2003   Hyperlipidemia    Hypertension    Hypothyroidism    Obesity    PONV (postoperative nausea and vomiting)    severe--wants ? scop patch & drugs    Past Surgical History:  Procedure Laterality Date   ABDOMINAL HYSTERECTOMY     BREAST SURGERY     CARDIAC CATHETERIZATION      CHOLECYSTECTOMY N/A 06/28/2016   Procedure: LAPAROSCOPIC CHOLECYSTECTOMY WITH POSSIBLE INTRAOPERATIVE CHOLANGIOGRAM;  Surgeon: Jina Nephew, MD;  Location: [facility];  Service: General;  Laterality: N/A;   EYE SURGERY     cataract left eye   LEFT HEART CATHETERIZATION WITH CORONARY ANGIOGRAM N/A 10/05/2012   Procedure: LEFT HEART CATHETERIZATION WITH CORONARY ANGIOGRAM;  Surgeon: Peter M Swaziland, MD;  Location: [facility] CATH LAB;  Service: Cardiovascular;  Laterality: N/A;   MASTECTOMY     left   PERCUTANEOUS CORONARY STENT INTERVENTION (PCI-S)  10/05/2012   Procedure: PERCUTANEOUS CORONARY STENT INTERVENTION (PCI-S);  Surgeon: Peter M Swaziland, MD;  Location: [facility] CATH LAB;  Service: Cardiovascular;;   SKIN CANCER EXCISION  2013   Basal cell-left neck   TOTAL ABDOMINAL HYSTERECTOMY W/ BILATERAL SALPINGOOPHORECTOMY  2003    Family History  Problem Relation Age of Onset   Hypertension Mother    Heart disease Father     Social History   Tobacco Use   Smoking status: Former    Current packs/day: 0.00    Average packs/day: 1.5 packs/day for 14.3 years (21.5 ttl pk-yrs)    Types: Cigarettes    Start date: 05/17/1975    Quit date: 09/03/1989    Years since quitting: 34.2   Smokeless tobacco: Never  Vaping Use   Vaping status: Never Used  Substance Use Topics   Alcohol use: Yes    Alcohol/week: 0.0 standard drinks of alcohol    Comment: occasional  Drug use: No    Medications: I have reviewed the patient's current medications. Allergies as of 11/14/2023       Reactions   Codeine Nausea And Vomiting   deathly sick   Demerol [meperidine]    Pt states all pain meds make her sick on her stomach   Dilaudid [hydromorphone Hcl] Nausea And Vomiting   Morphine And Codeine Nausea And Vomiting   Atorvastatin  Nausea And Vomiting        Medication List        Accurate as of November 14, 2023 10:16 AM. If you have any questions, ask your nurse or doctor.          STOP taking these  medications    Victoza 18 MG/3ML Sopn Generic drug: liraglutide Stopped by: Jeanni Allshouse A Meaghen Vecchiarelli       TAKE these medications    acetaminophen  500 MG tablet Commonly known as: TYLENOL  Take 1,000 mg by mouth every 6 (six) hours as needed (for pain.).   aspirin  EC 81 MG tablet Take 81 mg by mouth daily.   citalopram  40 MG tablet Commonly known as: CELEXA  Take 60 mg by mouth daily.   ibuprofen  200 MG tablet Commonly known as: ADVIL  Take 3 tablets (600 mg total) by mouth every 6 (six) hours as needed for mild pain, moderate pain or cramping (for pain.).   Jardiance 25 MG Tabs tablet Generic drug: empagliflozin TK 1 T PO D   levothyroxine  100 MCG tablet Commonly known as: SYNTHROID  TK 1 T PO D   lisinopril  10 MG tablet Commonly known as: ZESTRIL  Take 10 mg by mouth daily.   metFORMIN 500 MG tablet Commonly known as: GLUCOPHAGE Take 250-1,000 mg by mouth in the morning and at bedtime. (250 mg in the morning & 1000 mg in the evening) What changed: how much to take   metoprolol  tartrate 25 MG tablet Commonly known as: LOPRESSOR  TAKE 1/2 TABLET(12.5 MG) BY MOUTH TWICE DAILY   nitroGLYCERIN  0.4 MG SL tablet Commonly known as: NITROSTAT  Place 1 tablet (0.4 mg total) under the tongue every 5 (five) minutes as needed for chest pain.   rosuvastatin  5 MG tablet Commonly known as: CRESTOR  TAKE 1 TABLET BY MOUTH EVERY DAY   Trulicity 1.5 MG/0.5ML Soaj Generic drug: Dulaglutide Inject 1.5 mg into the skin once a week.         ROS:  Constitutional: negative for chills, fatigue, and fevers Eyes: negative for visual disturbance and pain Ears, nose, mouth, throat, and face: positive for sore throat, negative for ear drainage and sinus problems Respiratory: negative for cough, wheezing, and shortness of breath Cardiovascular: negative for chest pain and palpitations Gastrointestinal: negative for abdominal pain, nausea, reflux symptoms, and  vomiting Genitourinary:negative for dysuria and frequency Integument/breast: negative for dryness and rash Hematologic/lymphatic: negative for bleeding and lymphadenopathy Musculoskeletal:positive for neck pain, negative for back pain Neurological: negative for dizziness and tremors Endocrine: negative for temperature intolerance  Blood pressure 114/71, pulse 75, temperature 98 F (36.7 C), temperature source Oral, resp. rate 14, height 5' 4 (1.626 m), weight 193 lb (87.5 kg), SpO2 94%. Physical Exam Vitals reviewed.  Constitutional:      Appearance: Normal appearance.  HENT:     Head: Normocephalic and atraumatic.   Eyes:     Extraocular Movements: Extraocular movements intact.     Pupils: Pupils are equal, round, and reactive to light.    Cardiovascular:     Rate and Rhythm: Normal rate and regular rhythm.  Pulmonary:  Effort: Pulmonary effort is normal.     Breath sounds: Normal breath sounds.  Abdominal:     Comments: Abdomen soft, nondistended, no percussion tenderness, nontender to palpation; no rigidity, guarding, rebound tenderness   Musculoskeletal:        General: Normal range of motion.     Cervical back: Normal range of motion.   Skin:    General: Skin is warm and dry.   Neurological:     General: No focal deficit present.     Mental Status: She is alert and oriented to person, place, and time.   Psychiatric:        Mood and Affect: Mood normal.        Behavior: Behavior normal.     Results: No results found for this or any previous visit (from the past 48 hours).  No results found.   Assessment & Plan:  [name] is a 63 y.o. female who presents to discuss colonoscopy.  -We discussed methods for screening for colon cancer, and the role colonoscopy plays in screening for colon cancer -The risk and benefits of colonoscopy were discussed including but not limited to bleeding, infection, missed lesions, and perforation of the colon requiring  surgery.  After careful consideration, [name] has decided to proceed with colonoscopy.  -Patient tentatively scheduled for colonoscopy on 7/22 -Prescription provided to the patient for Sutab prep, as well as an instruction sheet on how to appropriately administer the prep. -Coupon also provided to the patient for Sutab prep -Information provided to the patient regarding colonoscopy and screening for colon cancer -Advised that she will need to hold her Trulicity for 8 days and her Jardiance for 3 days   All questions were answered to the satisfaction of the patient and family.  Note: Portions of this report may have been transcribed using voice recognition software. Every effort has been made to ensure accuracy; however, inadvertent computerized transcription errors may still be present.   Dorothyann Brittle, DO [facility] [address] [place], [place] [address] [phone] (office)

## 2023-11-14 NOTE — Addendum Note (Signed)
 Addended by: SAUNDRA TAWNI DEL on: 11/14/2023 11:14 AM   Modules accepted: Orders

## 2023-12-05 ENCOUNTER — Ambulatory Visit (HOSPITAL_COMMUNITY): Payer: Self-pay | Admitting: Anesthesiology

## 2023-12-05 ENCOUNTER — Encounter (HOSPITAL_COMMUNITY)
Admission: RE | Disposition: A | Discharge status: Home / Self Care | Payer: Self-pay | Source: Home / Self Care | Attending: Surgery

## 2023-12-05 ENCOUNTER — Ambulatory Visit (HOSPITAL_COMMUNITY)
Admission: RE | Admit: 2023-12-05 | Discharge: 2023-12-05 | Disposition: A | Discharge status: Home / Self Care | Attending: Surgery | Admitting: Surgery

## 2023-12-05 ENCOUNTER — Other Ambulatory Visit: Payer: Self-pay

## 2023-12-05 ENCOUNTER — Encounter (HOSPITAL_COMMUNITY): Payer: Self-pay | Admitting: Surgery

## 2023-12-05 ENCOUNTER — Encounter (HOSPITAL_COMMUNITY): Payer: Self-pay | Admitting: Anesthesiology

## 2023-12-05 DIAGNOSIS — Z87891 Personal history of nicotine dependence: Secondary | ICD-10-CM | POA: Diagnosis not present

## 2023-12-05 DIAGNOSIS — K648 Other hemorrhoids: Secondary | ICD-10-CM

## 2023-12-05 DIAGNOSIS — Z1211 Encounter for screening for malignant neoplasm of colon: Secondary | ICD-10-CM | POA: Insufficient documentation

## 2023-12-05 DIAGNOSIS — Z955 Presence of coronary angioplasty implant and graft: Secondary | ICD-10-CM | POA: Insufficient documentation

## 2023-12-05 DIAGNOSIS — I2510 Atherosclerotic heart disease of native coronary artery without angina pectoris: Secondary | ICD-10-CM | POA: Insufficient documentation

## 2023-12-05 DIAGNOSIS — Z853 Personal history of malignant neoplasm of breast: Secondary | ICD-10-CM | POA: Diagnosis not present

## 2023-12-05 DIAGNOSIS — Z9221 Personal history of antineoplastic chemotherapy: Secondary | ICD-10-CM | POA: Insufficient documentation

## 2023-12-05 DIAGNOSIS — Z800 Family history of malignant neoplasm of digestive organs: Secondary | ICD-10-CM | POA: Diagnosis not present

## 2023-12-05 DIAGNOSIS — Z7984 Long term (current) use of oral hypoglycemic drugs: Secondary | ICD-10-CM | POA: Insufficient documentation

## 2023-12-05 DIAGNOSIS — E669 Obesity, unspecified: Secondary | ICD-10-CM | POA: Diagnosis not present

## 2023-12-05 DIAGNOSIS — E1165 Type 2 diabetes mellitus with hyperglycemia: Secondary | ICD-10-CM | POA: Insufficient documentation

## 2023-12-05 DIAGNOSIS — I10 Essential (primary) hypertension: Secondary | ICD-10-CM | POA: Diagnosis not present

## 2023-12-05 DIAGNOSIS — I252 Old myocardial infarction: Secondary | ICD-10-CM | POA: Diagnosis not present

## 2023-12-05 DIAGNOSIS — K649 Unspecified hemorrhoids: Secondary | ICD-10-CM | POA: Diagnosis not present

## 2023-12-05 HISTORY — DX: Acute myocardial infarction, unspecified: I21.9

## 2023-12-05 HISTORY — PX: COLONOSCOPY: SHX5424

## 2023-12-05 LAB — GLUCOSE, CAPILLARY: Glucose-Capillary: 234 mg/dL — ABNORMAL HIGH (ref 70–99)

## 2023-12-05 SURGERY — COLONOSCOPY
Anesthesia: General

## 2023-12-05 MED ORDER — SIMETHICONE 40 MG/0.6ML PO SUSP
ORAL | Status: DC | PRN
  Administered 2023-12-05: 60 mL

## 2023-12-05 MED ORDER — PROPOFOL 500 MG/50ML IV EMUL
INTRAVENOUS | Status: DC | PRN
  Administered 2023-12-05: 20 mg via INTRAVENOUS
  Administered 2023-12-05: 125 ug/kg/min via INTRAVENOUS
  Administered 2023-12-05: 180 mg via INTRAVENOUS

## 2023-12-05 MED ORDER — LACTATED RINGERS IV SOLN: INTRAVENOUS | Status: DC | PRN

## 2023-12-05 MED ORDER — LIDOCAINE 2% (20 MG/ML) 5 ML SYRINGE
INTRAMUSCULAR | Status: DC | PRN
  Administered 2023-12-05: 80 mg via INTRAVENOUS

## 2023-12-05 MED ORDER — LACTATED RINGERS IV SOLN: INTRAVENOUS | Status: DC

## 2023-12-05 NOTE — Anesthesia Procedure Notes (Signed)
 Date/Time: 12/05/2023 8:17 AM  Performed by: Barbarann Verneita RAMAN, CRNAPre-anesthesia Checklist: Patient identified, Emergency Drugs available, Suction available, Timeout performed and Patient being monitored Patient Re-evaluated:Patient Re-evaluated prior to induction Oxygen Delivery Method: Nasal Cannula

## 2023-12-05 NOTE — Anesthesia Preprocedure Evaluation (Signed)
 Anesthesia Evaluation  Patient identified by MRN, date of birth, ID band Patient awake    Reviewed: Allergy & Precautions, H&P , NPO status , Patient's Chart, lab work & pertinent test results, reviewed documented beta blocker date and time   History of Anesthesia Complications (+) PONV and history of anesthetic complications  Airway Mallampati: II  TM Distance: >3 FB Neck ROM: full    Dental no notable dental hx.    Pulmonary neg pulmonary ROS, former smoker   Pulmonary exam normal breath sounds clear to auscultation       Cardiovascular Exercise Tolerance: Good hypertension, + CAD, + Past MI and + Cardiac Stents   Rhythm:regular Rate:Normal     Neuro/Psych negative neurological ROS  negative psych ROS   GI/Hepatic negative GI ROS, Neg liver ROS,,,  Endo/Other  diabetesHypothyroidism    Renal/GU negative Renal ROS  negative genitourinary   Musculoskeletal   Abdominal   Peds  Hematology negative hematology ROS (+)   Anesthesia Other Findings   Reproductive/Obstetrics negative OB ROS                              Anesthesia Physical Anesthesia Plan  ASA: 3  Anesthesia Plan: General   Post-op Pain Management:    Induction:   PONV Risk Score and Plan: Propofol  infusion  Airway Management Planned:   Additional Equipment:   Intra-op Plan:   Post-operative Plan:   Informed Consent: I have reviewed the patients History and Physical, chart, labs and discussed the procedure including the risks, benefits and alternatives for the proposed anesthesia with the patient or authorized representative who has indicated his/her understanding and acceptance.     Dental Advisory Given  Plan Discussed with: CRNA  Anesthesia Plan Comments:         Anesthesia Quick Evaluation

## 2023-12-05 NOTE — Op Note (Signed)
 [facility] Patient Name: [name] Procedure Date: 12/05/2023 8:08 AM MRN: [id number] Date of Birth: 10/20/1960 Attending MD: Dorothyann LABOR Caesar Mannella DO, ,  CSN: [id number] Age: 63 Admit Type: Outpatient Procedure:                Colonoscopy Indications:               Providers:                Dorothyann A. Jermiya Reichl DO, Harlene Lips,                            Daphne Rosalynn Christians, Technician Referring MD:              Medicines:                Monitored Anesthesia Care Complications:            No immediate complications. Estimated Blood Loss:     Estimated blood loss: none. Procedure:                Pre-Anesthesia Assessment:                           - Prior to the procedure, a History and Physical                            was performed, and patient medications, allergies                            and sensitivities were reviewed. The patient's                            tolerance of previous anesthesia was reviewed.                           - The risks and benefits of the procedure and the                            sedation options and risks were discussed with the                            patient. All questions were answered and informed                            consent was obtained.                           - Monitored anesthesia care under the supervision                            of a CRNA was determined to be medically necessary                            for this procedure based on review of the patient's                            medical history, medications,  and prior anesthesia                            history.                           After obtaining informed consent, the colonoscope                            was passed under direct vision. Throughout the                            procedure, the patient's blood pressure, pulse, and                            oxygen saturations were monitored continuously. The                             [id number]-HQ190L ([id number]) scope was introduced through                            the anus and advanced to the the cecum, identified                            by appendiceal orifice and ileocecal valve. The                            colonoscopy was performed without difficulty. The                            patient tolerated the procedure well. The quality                            of the bowel preparation was evaluated using the                            BBPS [facility] Bowel Preparation Scale) with scores                            of: Right Colon = 2 (minor amount of residual                            staining, small fragments of stool and/or opaque                            liquid, but mucosa seen well), Transverse Colon = 3                            (entire mucosa seen well with no residual staining,                            small fragments of stool or opaque liquid) and Left  Colon = 3 (entire mucosa seen well with no residual                            staining, small fragments of stool or opaque                            liquid). The total BBPS score equals 8. The                            ileocecal valve and the rectum were photographed.                            Scope insertion time was 10 minutes. Scope                            withdrawal time was 11 minutes. The total duration                            of the procedure was 21 minutes. Scope In: 8:22:02 AM Scope Out: 8:43:33 AM Scope Withdrawal Time: 0 hours 11 minutes 10 seconds  Total Procedure Duration: 0 hours 21 minutes 31 seconds  Findings:      Hemorrhoids were found on perianal exam.      The exam was otherwise without abnormality on direct and retroflexion       views. Impression:               - Hemorrhoids found on perianal exam.                           - The examination was otherwise normal on direct                            and retroflexion views.                           -  No specimens collected. Moderate Sedation:      An independent trained observer was present and continuously monitored       the patient. Recommendation:           - Discharge patient to home.                           - Resume regular diet.                           - Continue present medications.                           - Repeat colonoscopy in 10 years for surveillance. Procedure Code(s):        --- Professional ---                           [phone], Colonoscopy, flexible; diagnostic, including                            collection of specimen(s)  by brushing or washing,                            when performed (separate procedure) Diagnosis Code(s):        --- Professional ---                           K64.9, Unspecified hemorrhoids                           Z12.11, Encounter for screening for malignant                            neoplasm of colon CPT copyright 2022 American Medical Association. All rights reserved. The codes documented in this report are preliminary and upon coder review may  be revised to meet current compliance requirements. Dorothyann A Teresa Freeman DO,  12/05/2023 8:49:09 AM Number of Addenda: 0

## 2023-12-05 NOTE — Interval H&P Note (Signed)
 History and Physical Interval Note:  12/05/2023 7:22 AM  [name]  has presented today for surgery, with the diagnosis of SCREENING FOR COLON CANCER.  The various methods of treatment have been discussed with the patient and family. After consideration of risks, benefits and other options for treatment, the patient has consented to  Procedure(s) with comments: COLONOSCOPY (N/A) - W/ PROPOFOL  as a surgical intervention.  The patient's history has been reviewed, patient examined, no change in status, stable for surgery.  I have reviewed the patient's chart and labs.  Questions were answered to the patient's satisfaction.     Ghadeer Kastelic A Deloria Brassfield

## 2023-12-05 NOTE — Transfer of Care (Signed)
 Immediate Anesthesia Transfer of Care Note  Patient: [name]  Procedure(s) Performed: COLONOSCOPY  Patient Location: Endoscopy Unit  Anesthesia Type:General  Level of Consciousness: awake and patient cooperative  Airway & Oxygen Therapy: Patient Spontanous Breathing  Post-op Assessment: Report given to RN and Post -op Vital signs reviewed and stable  Post vital signs: Reviewed and stable  Last Vitals:  Vitals Value Taken Time  BP 129/61 12/05/23 08:47  Temp 36.7 C 12/05/23 08:47  Pulse 75 12/05/23 08:47  Resp 12 12/05/23 08:47  SpO2 100 % 12/05/23 08:47    Last Pain:  Vitals:   12/05/23 0847  TempSrc: Oral  PainSc: 0-No pain         Complications: No notable events documented.

## 2023-12-06 ENCOUNTER — Encounter (HOSPITAL_COMMUNITY): Payer: Self-pay | Admitting: Surgery

## 2023-12-06 NOTE — Anesthesia Postprocedure Evaluation (Signed)
 Anesthesia Post Note  Patient: [name]  Procedure(s) Performed: COLONOSCOPY  Patient location during evaluation: Phase II Anesthesia Type: General Level of consciousness: awake Pain management: pain level controlled Vital Signs Assessment: post-procedure vital signs reviewed and stable Respiratory status: spontaneous breathing and respiratory function stable Cardiovascular status: blood pressure returned to baseline and stable Postop Assessment: no headache and no apparent nausea or vomiting Anesthetic complications: no Comments: Late entry   No notable events documented.   Last Vitals:  Vitals:   12/05/23 0728 12/05/23 0847  BP: 135/62 129/61  Pulse: 73 75  Resp: 20 12  Temp: 36.6 C 36.7 C  SpO2: 94% 100%    Last Pain:  Vitals:   12/05/23 0847  TempSrc: Oral  PainSc: 0-No pain                 Yvonna JINNY Bosworth
